# Patient Record
Sex: Male | Born: 1965 | Race: White | Hispanic: No | Marital: Single | Smoking: Never smoker
Health system: Southern US, Community
[De-identification: ages and names within clinical notes are randomized; demographics above are authoritative.]

## PROBLEM LIST (undated history)

## (undated) DIAGNOSIS — I639 Cerebral infarction, unspecified: Secondary | ICD-10-CM

## (undated) DIAGNOSIS — I219 Acute myocardial infarction, unspecified: Secondary | ICD-10-CM

## (undated) DIAGNOSIS — B20 Human immunodeficiency virus [HIV] disease: Secondary | ICD-10-CM

## (undated) HISTORY — PX: BACK SURGERY: SHX140

## (undated) HISTORY — PX: CARDIAC CATHETERIZATION: SHX172

## (undated) HISTORY — PX: OTHER SURGICAL HISTORY: SHX169

---

## 2007-09-27 ENCOUNTER — Ambulatory Visit: Payer: Self-pay | Admitting: Cardiology

## 2007-09-27 ENCOUNTER — Inpatient Hospital Stay (HOSPITAL_COMMUNITY): Admission: EM | Admit: 2007-09-27 | Discharge: 2007-10-03 | Payer: Self-pay | Admitting: Emergency Medicine

## 2009-03-20 ENCOUNTER — Ambulatory Visit (HOSPITAL_COMMUNITY): Admission: RE | Admit: 2009-03-20 | Discharge: 2009-03-20 | Payer: Self-pay | Admitting: Family Medicine

## 2009-09-02 ENCOUNTER — Ambulatory Visit (HOSPITAL_COMMUNITY): Admission: RE | Admit: 2009-09-02 | Discharge: 2009-09-02 | Payer: Self-pay | Admitting: General Surgery

## 2009-09-02 ENCOUNTER — Encounter (INDEPENDENT_AMBULATORY_CARE_PROVIDER_SITE_OTHER): Payer: Self-pay | Admitting: General Surgery

## 2010-11-26 ENCOUNTER — Other Ambulatory Visit: Payer: Self-pay | Admitting: Neurosurgery

## 2010-11-26 DIAGNOSIS — M549 Dorsalgia, unspecified: Secondary | ICD-10-CM

## 2010-11-26 DIAGNOSIS — M5410 Radiculopathy, site unspecified: Secondary | ICD-10-CM

## 2010-11-27 ENCOUNTER — Ambulatory Visit
Admission: RE | Admit: 2010-11-27 | Discharge: 2010-11-27 | Disposition: A | Discharge status: Home / Self Care | Payer: PRIVATE HEALTH INSURANCE | Source: Ambulatory Visit | Attending: Neurosurgery | Admitting: Neurosurgery

## 2010-11-27 DIAGNOSIS — M5410 Radiculopathy, site unspecified: Secondary | ICD-10-CM

## 2010-11-27 DIAGNOSIS — M549 Dorsalgia, unspecified: Secondary | ICD-10-CM

## 2010-12-08 ENCOUNTER — Other Ambulatory Visit: Payer: Self-pay | Admitting: Neurosurgery

## 2010-12-08 DIAGNOSIS — M5410 Radiculopathy, site unspecified: Secondary | ICD-10-CM

## 2010-12-08 DIAGNOSIS — M549 Dorsalgia, unspecified: Secondary | ICD-10-CM

## 2010-12-22 ENCOUNTER — Ambulatory Visit
Admission: RE | Admit: 2010-12-22 | Discharge: 2010-12-22 | Disposition: A | Discharge status: Home / Self Care | Payer: PRIVATE HEALTH INSURANCE | Source: Ambulatory Visit | Attending: Neurosurgery | Admitting: Neurosurgery

## 2010-12-22 ENCOUNTER — Other Ambulatory Visit: Payer: Self-pay | Admitting: Neurosurgery

## 2010-12-22 DIAGNOSIS — M5410 Radiculopathy, site unspecified: Secondary | ICD-10-CM

## 2010-12-22 DIAGNOSIS — M549 Dorsalgia, unspecified: Secondary | ICD-10-CM

## 2011-01-28 LAB — COMPREHENSIVE METABOLIC PANEL
CO2: 29 mEq/L (ref 19–32)
Calcium: 9.1 mg/dL (ref 8.4–10.5)
Creatinine, Ser: 1.05 mg/dL (ref 0.4–1.5)
Glucose, Bld: 88 mg/dL (ref 70–99)
Potassium: 4.1 mEq/L (ref 3.5–5.1)
Total Protein: 7.8 g/dL (ref 6.0–8.3)

## 2011-01-28 LAB — CBC
MCHC: 34.3 g/dL (ref 30.0–36.0)
MCV: 92.7 fL (ref 78.0–100.0)
RDW: 13.8 % (ref 11.5–15.5)
WBC: 6.8 10*3/uL (ref 4.0–10.5)

## 2011-01-28 LAB — DIFFERENTIAL
Basophils Absolute: 0.1 10*3/uL (ref 0.0–0.1)
Basophils Relative: 1 % (ref 0–1)
Eosinophils Absolute: 0.1 10*3/uL (ref 0.0–0.7)
Lymphocytes Relative: 21 % (ref 12–46)
Lymphs Abs: 1.4 10*3/uL (ref 0.7–4.0)
Monocytes Absolute: 0.4 10*3/uL (ref 0.1–1.0)
Monocytes Relative: 6 % (ref 3–12)
Neutro Abs: 4.8 10*3/uL (ref 1.7–7.7)

## 2011-01-29 ENCOUNTER — Other Ambulatory Visit: Payer: Self-pay | Admitting: Neurosurgery

## 2011-01-29 DIAGNOSIS — M549 Dorsalgia, unspecified: Secondary | ICD-10-CM

## 2011-01-29 DIAGNOSIS — M5410 Radiculopathy, site unspecified: Secondary | ICD-10-CM

## 2011-02-03 ENCOUNTER — Ambulatory Visit
Admission: RE | Admit: 2011-02-03 | Discharge: 2011-02-03 | Disposition: A | Discharge status: Home / Self Care | Payer: PRIVATE HEALTH INSURANCE | Source: Ambulatory Visit | Attending: Neurosurgery | Admitting: Neurosurgery

## 2011-02-03 DIAGNOSIS — M549 Dorsalgia, unspecified: Secondary | ICD-10-CM

## 2011-02-03 DIAGNOSIS — M5410 Radiculopathy, site unspecified: Secondary | ICD-10-CM

## 2011-03-10 NOTE — Cardiovascular Report (Signed)
Joseph Walton, Walton               ACCOUNT NO.:  [id number]   MEDICAL RECORD NO.:  [id number]          PATIENT TYPE:  INP   LOCATION:  2807                         FACILITY:  [facility]   PHYSICIAN:  Colleen Can. Deborah Chalk, M.D.DATE OF BIRTH:  1965/12/27   DATE OF PROCEDURE:  09/27/2007  DATE OF DISCHARGE:                            CARDIAC CATHETERIZATION   PROCEDURE:  Left heart catheterization with selective coronary  angiography, left ventricular angiography, with percutaneous coronary  intervention of the right coronary artery, all in the setting of acute  inferior myocardial infarction.   TYPE AND SITE OF ENTRY:  Percutaneous, right femoral artery.   CATHETERS:  T 6-French 4-curved Judkins right and left coronary  catheters, a 6-French pigtail ventriculographic catheter, a 6-French JR-  4 guide with side holes, a combination of Prowater guidewire and an  Asahi soft guidewire, 2.5 x 15-mm Maverick balloon, a Fetch extraction  catheter, subsequent stents with a 3.5 x 32-mm Liberte distally and a  3.5 x a 15-mm Liberte proximally.   MEDICATIONS GIVEN DURING PROCEDURE:  Zofran 8 mg IV, verapamil 200 mcg  intracoronary, Pepcid, Plavix, heparin and Integrilin.   COMMENT:  It was difficult crossing the stenosis initially.  Brett Canales had a  good bit of nausea and hypotension, but resolved with fluid as well as  dopamine infusion.   PRESSURES:  Aortic pressure was 103/74.  LV was 99/16-24.  There was no  aortic valve gradient noted on pullback.   ANGIOGRAPHIC DATA:  1. The left main coronary artery had 40% to 50% distal stenosis.  It      was a relatively long vessel.  There were the left anterior      descending, intermediate and left circumflex that arose in somewhat      of trifurcation pattern.  2. Left circumflex:  The left circumflex had a large obtuse marginal      with a somewhat tubular 60% narrowing.  3. Left anterior descending had diffuse irregularities and a 40% to      50%  proximal narrowing.  4. The intermediate coronary was of moderate size with 40% to 50%      narrowing.  5. The right coronary artery was totally occluded proximally.  There      was a conus and RV branch with severe stenosis at the origin of      these vessels.   LEFT VENTRICULAR ANGIOGRAM:  The left ventricular angiogram was  performed after the PCI.  This showed inferior wall hypokinesis with a  global ejection fraction estimated to be 40% to 45%.   PERCUTANEOUS CORONARY INTERVENTION:  The JR-4 guide with side holes  provided adequate backup.  We had difficulty in getting the guidewire to  cross the occluded portion of the vessel.  It would appear that there  was a flap from a dissected plaque.  We were able to pass under the  medial side without having subintimal dissection plane.  We initially  passed the wire distally and were able to predilated with a 2.5 x 15-mm  Maverick balloon up to 18-20 atmospheres.  At that point  in time, we had  a considerable intraluminal filling defect consistent with clot.  The  Fetch catheter was used with 3 separate runs; this resulted in removal  of obvious thrombus and improvement of the vessel angiographically.  We  then returned with initially a 3.5 x 32-mm Liberte through the distal  stenosis and then overlap that stent proximally with a 3.5 x 15-mm  Liberte proximally.  We were all within approximately a centimeter of  the ostium and there was some residual narrowing at the ostium, but it  was not felt to be flow-limiting.  After the stents were placed, there  was a posterior lateral branch which had abrupt cutoff.  We passed the  guidewire distal in that to try to mechanically disrupt what was  presumed to be a distal clot, but really did not result in intermediate  reperfusion.  Overall, we had excellent flow into 2 different branches  on the inferior wall which included the large posterior descending  vessel and was felt to be a successful  angioplasty.   OVERALL IMPRESSION:  1. Acute inferior myocardial infarction with successful angioplasty      and stent placement in the right coronary artery with non-drug-      eluting stents.  2. 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   DISCUSSION:  The patient will be managed medically initially.  It is  felt that he should be able to have a satisfactory short-term benefit  with the angioplasty procedure, but his long-term results will hinge  more on modification of cardiovascular risk factors.      Colleen Can. Deborah Chalk, M.D.  Electronically Signed     SNT/MEDQ  D:  09/27/2007  T:  09/28/2007  Job:  [id number]

## 2011-03-10 NOTE — H&P (Signed)
[name], Joseph Walton               ACCOUNT NO.:  [id number]   MEDICAL RECORD NO.:  [id number]          PATIENT TYPE:  INP   LOCATION:  2807                         FACILITY:  [facility]   PHYSICIAN:  Colleen Can. Deborah Chalk, M.D.DATE OF BIRTH:  1966/09/11   DATE OF ADMISSION:  09/27/2007  DATE OF DISCHARGE:                              HISTORY & PHYSICAL   PRIMARY CARDIOLOGIST:  The patient has no cardiologist.   TOTAL VISIT TIME:  Approximately 30 minutes.   CHIEF COMPLAINT:  He is being transferred for chest pain and abnormal  EKG from [facility].  The patient is full code.  The patient  was the historian, he was a good historian.   HISTORY OF PRESENT ILLNESS:  Mr. Joseph Walton is a 45 year old male with no  history of coronary artery disease, history of hypertension and history  of gastroesophageal reflux disease who notes a sudden onset of chest  pain, less precordial, described as a gaseous feeling anteriorly around  1:30 p.m. on the day of admission.  He notes it was 7/10 and he took a  full dose aspirin and had intermittent episodes of discomfort until it  became severe again around 3:30 p.m. and took another aspirin.  Since  then, he has had multiple episodes of nausea and vomiting.  The patient  then took a Nexium at 4:30 p.m. and at 6:00 p.m. and then drove himself  to [facility] where an EKG was performed showing a possible  inferior, posterior infarct.  The patient was given aspirin 324 mg,  which were 4 baby aspirin and 1 inch of NitroPaste.  His blood pressure  is 166/102, but his heart rate was in the low 100s at the time and was  transferred emergently to [facility] Emergency Room.  In the emergency  room, his vital signs were similar.  He was given a heparin bolus 5000  then a drip.  He was also given a nitroglycerin drip and Lopressor 5 mg  IV x2 and he notes his chest pain went from a 5/10 to a 0/10 after the  treatment.  He was emergently taken to the  catheterization laboratory.   PAST MEDICAL HISTORY:  As listed above.  He is also status post  tonsillectomy.   ALLERGIES:  HE IS ALLERGIC TO SULFA, WHICH CAUSES NAUSEA.   MEDICATIONS:  He takes hydrochlorothiazide as needed for elevated blood  pressure and Nexium as needed.   The patient has never had a heart catheterization and never had an  echocardiogram.   SOCIAL HISTORY:  He lives alone.  He drinks alcohol occasionally.  He  has been smoking on and off since the age of 3.   FAMILY HISTORY:  He notes, his maternal mother, who he is not in contact  with, had a heart attack in her 84s.   REVIEW OF SYSTEMS:  A 14-point review of systems negative as stated  above.   PHYSICAL EXAMINATION:  VITAL SIGNS:  He is afebrile with a pulse of 102,  respiratory rate was 22, blood pressure 160/100 with saturations at 98%  on 2  liters.  IN GENERAL:  He is a male of his stated age in mild distress.  HEENT:  Normocephalic, atraumatic.  His pupils are equal, round and  reactive to light.  Extraocular movements being intact.  Sclerae are  clear.  Oropharynx shows no posterior pharyngeal lesions.  NECK:  Supple with no lymphadenopathy or thyromegaly.  No jugular venous  distention was noted.  No carotid bruits.  CARDIOVASCULAR EXAM:  Is a regular rhythm with a fast rate with no  murmurs, rubs or gallops.  Normal S1, S2.  No S3 or S4 noted.  VASCULAR EXAM:  Includes now femoral, which is 2+ bilaterally and  symmetric and dorsalis pedis, which are also 2+ bilaterally symmetric.  ABDOMEN:  Mildly obese, soft, nontender, nondistended.  No  hepatosplenomegaly noted.  EXTREMITIES:  Showed no clubbing, cyanosis or edema.  LUNGS:  Clear to auscultation bilaterally.  MUSCULOSKELETAL:  He had no signs of joint deformities or effusions.  SKIN:  Shows no rashes or lesions.  NEUROLOGIC EXAM:  He is alert and oriented x4.  Cranial nerves II-XII  grossly intact.  Strength and sensation grossly intact.    LABORATORY DATA:  Chest x-ray was pending.  EKG, performed in the  emergency room here at [facility], which showed sinus tachycardia, event  of 122 with normal axis.  He had no signs of LVH.  He had 2 mm ST  elevations in 2 and AVL and ST depressions in V1 to V3 with the most  prominent being in V2, which was 3 mm and he had prominent R wave  consistent with a possible inferior/posterior MI.  The patient's PR  interval is 146, QRS 108, QT corrected at 445.  Labs are pending.   ASSESSMENT/PLAN:  This is a patient with no history of heart disease who  presented with an acute myocardial infarction, still having chest pain.  He was emergently taken to the catheterization laboratory with Dr.  Deborah Chalk being the interventional cardiologist.  He will likely be  retained on beta-blockade and started on an ACE inhibitor with an  assessment of the ejection fraction after revascularization.  Yet, he  will also likely be on statin therapy and we will check his lipids.  He  will also be on anticoagulants and antiplatelet therapy.  He notes he  will stop smoking and he was advised of this in the emergency room.  His  blood pressure will be controlled with a beta-blockade and possibly an  ACE inhibitor.      Darryl D. Prime, MD   Electronically Signed     ______________________________  Colleen Can. Deborah Chalk, M.D.    DDP/MEDQ  D:  09/27/2007  T:  09/28/2007  Job:  [id number]

## 2011-03-10 NOTE — Discharge Summary (Signed)
NAMEAYVIN, LIPINSKI               ACCOUNT NO.:  [id number]   MEDICAL RECORD NO.:  [id number]          PATIENT TYPE:  INP   LOCATION:  2039                         FACILITY:  [facility]   PHYSICIAN:  Colleen Can. Deborah Chalk, M.D.DATE OF BIRTH:  December 11, 1965   DATE OF ADMISSION:  09/27/2007  DATE OF DISCHARGE:  10/03/2007                               DISCHARGE SUMMARY   DISCHARGE DIAGNOSES:  1. Acute ST elevation inferior myocardial infarction, with subsequent      percutaneous coronary intervention to the right coronary artery      with two Liberte stents (3.5 x 32 distally, 3.5 x 15 mm      proximally).  2. Hypertension.  3. Dyslipidemia.  4. Gastroesophageal reflux disease.   HISTORY OF PRESENT ILLNESS:  The patient is a 45 year old white male who  presented initially to Urgent Care with chest pain.  He thought that he  had indigestion.  He had taking aspirin as well as multiple Nexium.  His  EKG showed acute changes.  He was subsequent brought to the [facility]  emergency room with code STEMI called.  His onset of chest pain was  approximately 1:30 p.m. on the day of admission, and he was transferred  at approximately 6 p.m.   Please see the history and physical for the patient presentation and  profile.   LABORATORY DATA ON ADMISSION:  CK total was 4719, with an MB fraction of  423.  Troponins initially were undeterminable, the third one was 99.  His chemistry showed a BUN of 8, creatinine 0.8, potassium was 3.8,  glucose was 116.  CBC showed a white count of 11,000, hemoglobin 13,  hematocrit 37.  His total cholesterol was 158, LDL 108, HDL is 18,  triglycerides are 159.   Chest x-ray showed right upper lobe scarring versus subsegmental  atelectasis.   HOSPITAL COURSE:  The patient was admitted with code STEMI called.  He  was taken emergently to the cardiac catheterization lab per Dr. Roger Shelter.  That procedure was tolerated well, without any known  complications.  The  proximal right coronary was 110 occluded.  The conus  RV branch had severe stenosis.  The left main has a 40%-50% distal  narrowing.  The left circumflex has a 60% narrowing in obtuse marginal  branch.  The LAD has diffuse irregularities of 40%-50% proximally.  The  intermediate has 40-50% narrowings as well.  Subsequent PCI was  performed of the right coronary.  Subsequent stents times two were  placed.  There was a good result, with partial residual occlusion of a  distal posterolateral branch.  His ejection fraction was 40%-45%.  Postprocedure, he was transferred to the coronary care unit.  He has  basically done well throughout the remainder of his hospitalization.  He  has had some intermittent fever, with negative CBC and negative  urinalysis.  This is felt to be secondary to the myocardial injury.  He  was subsequently transferred down to 2000.  Cardiac rehab has been  initiated.  He has had no further complaints of chest pain, and today on  October 03, 2007, he is doing well.  Physical exam is unremarkable.  His  maximum temperature has been 99.1, and he is felt to be a satisfactory  candidate for discharge today.   DISCHARGE CONDITION:  Stable.   DISCHARGE DIET:  Low salt, heart healthy.   ACTIVITY:  No lifting, no driving, no sexual activity.  He may walk 5-10  minutes 2 times a day.   He is to avoid any types of straining or activities and would cause  chest pain, shortness of breath, dizziness. etc.  He is strongly  encouraged to refrain from smoking.   DISCHARGE MEDICATIONS:  1. Pravachol 40 mg a day.  2. Pepcid 20 mg 2 times a day.  3. Lopressor 50 b.i.d.  4. Aspirin 325 mg a day.  5. Lisinopril 5 mg a day.  6. Plavix 75 mg a day.  7. Nitroglycerin p.r.n..   We will plan on seeing him back in the office in approximately 10 days.  He is asked to call to schedule that appointment, certainly sooner if  any problems arise in the interim.      Sharlee Blew,  N.P.      Colleen Can. Deborah Chalk, M.D.  Electronically Signed    LC/MEDQ  D:  10/03/2007  T:  10/03/2007  Job:  [id number]

## 2011-07-28 ENCOUNTER — Other Ambulatory Visit: Payer: Self-pay | Admitting: Neurosurgery

## 2011-07-28 DIAGNOSIS — M549 Dorsalgia, unspecified: Secondary | ICD-10-CM

## 2011-08-03 LAB — CBC
HCT: 36.6 — ABNORMAL LOW
Hemoglobin: 12.5 — ABNORMAL LOW
MCHC: 35
MCV: 89.7
MCV: 92.1
Platelets: 200
WBC: 11.1 — ABNORMAL HIGH
WBC: 6

## 2011-08-03 LAB — I-STAT EC8
Acid-base deficit: 2
HCT: 44
Hemoglobin: 15
Operator id: 211741
TCO2: 24
pH, Arterial: 7.384

## 2011-08-03 LAB — DIFFERENTIAL
Basophils Absolute: 0
Basophils Relative: 0
Eosinophils Absolute: 0 — ABNORMAL LOW
Eosinophils Relative: 0
Lymphs Abs: 1.6
Monocytes Absolute: 0.5
Monocytes Relative: 4
Neutrophils Relative %: 81 — ABNORMAL HIGH

## 2011-08-03 LAB — BASIC METABOLIC PANEL
Calcium: 8.2 — ABNORMAL LOW
Chloride: 105
GFR calc Af Amer: >60
GFR calc non Af Amer: >60
Glucose, Bld: 103 — ABNORMAL HIGH
Potassium: 3.8
Sodium: 137
Sodium: 139

## 2011-08-03 LAB — URINE CULTURE
Colony Count: NO GROWTH
Culture: NO GROWTH
Special Requests: NEGATIVE

## 2011-08-03 LAB — URINALYSIS, ROUTINE W REFLEX MICROSCOPIC
Bilirubin Urine: NEGATIVE
Hgb urine dipstick: NEGATIVE
Specific Gravity, Urine: 1.035 — ABNORMAL HIGH
pH: 6.5

## 2011-08-03 LAB — CARDIAC PANEL(CRET KIN+CKTOT+MB+TROPI)
CK, MB: 212.7 — ABNORMAL HIGH
Relative Index: 9 — ABNORMAL HIGH
Total CK: 4719 — ABNORMAL HIGH
Troponin I: >100

## 2011-08-03 LAB — LIPID PANEL
HDL: 18 — ABNORMAL LOW
LDL Cholesterol: 108 — ABNORMAL HIGH
Triglycerides: 159 — ABNORMAL HIGH

## 2011-08-03 LAB — URINE MICROSCOPIC-ADD ON

## 2011-08-03 LAB — POCT I-STAT CREATININE: Operator id: 211741

## 2011-08-04 ENCOUNTER — Ambulatory Visit
Admission: RE | Admit: 2011-08-04 | Discharge: 2011-08-04 | Disposition: A | Discharge status: Home / Self Care | Payer: PRIVATE HEALTH INSURANCE | Source: Ambulatory Visit | Attending: Neurosurgery | Admitting: Neurosurgery

## 2011-08-04 ENCOUNTER — Other Ambulatory Visit: Payer: Self-pay | Admitting: Neurosurgery

## 2011-08-04 DIAGNOSIS — M549 Dorsalgia, unspecified: Secondary | ICD-10-CM

## 2011-08-04 MED ORDER — METHYLPREDNISOLONE ACETATE 40 MG/ML INJ SUSP (RADIOLOG
120.0000 mg | Freq: Once | INTRAMUSCULAR | Status: AC
  Administered 2011-08-04: 120 mg via EPIDURAL

## 2011-08-04 MED ORDER — IOHEXOL 180 MG/ML  SOLN
1.0000 mL | Freq: Once | INTRAMUSCULAR | Status: AC | PRN
  Administered 2011-08-04: 1 mL via EPIDURAL

## 2012-12-28 ENCOUNTER — Other Ambulatory Visit: Payer: Self-pay | Admitting: Neurosurgery

## 2012-12-28 DIAGNOSIS — M549 Dorsalgia, unspecified: Secondary | ICD-10-CM

## 2013-01-02 ENCOUNTER — Ambulatory Visit
Admission: RE | Admit: 2013-01-02 | Discharge: 2013-01-02 | Disposition: A | Discharge status: Home / Self Care | Payer: PRIVATE HEALTH INSURANCE | Source: Ambulatory Visit | Attending: Neurosurgery | Admitting: Neurosurgery

## 2013-01-02 DIAGNOSIS — M549 Dorsalgia, unspecified: Secondary | ICD-10-CM

## 2013-01-02 MED ORDER — IOHEXOL 180 MG/ML  SOLN
1.0000 mL | Freq: Once | INTRAMUSCULAR | Status: AC | PRN
  Administered 2013-01-02: 1 mL via EPIDURAL

## 2013-01-02 MED ORDER — METHYLPREDNISOLONE ACETATE 40 MG/ML INJ SUSP (RADIOLOG
120.0000 mg | Freq: Once | INTRAMUSCULAR | Status: AC
  Administered 2013-01-02: 120 mg via EPIDURAL

## 2013-01-09 ENCOUNTER — Other Ambulatory Visit: Payer: Self-pay | Admitting: Neurosurgery

## 2013-01-16 ENCOUNTER — Ambulatory Visit
Admission: RE | Admit: 2013-01-16 | Discharge: 2013-01-16 | Disposition: A | Discharge status: Home / Self Care | Payer: PRIVATE HEALTH INSURANCE | Source: Ambulatory Visit | Attending: Neurosurgery | Admitting: Neurosurgery

## 2013-01-16 ENCOUNTER — Other Ambulatory Visit: Payer: Self-pay | Admitting: Neurosurgery

## 2013-01-16 VITALS — BP 141/82 | HR 91

## 2013-01-16 DIAGNOSIS — M549 Dorsalgia, unspecified: Secondary | ICD-10-CM

## 2013-01-16 MED ORDER — IOHEXOL 180 MG/ML  SOLN
1.0000 mL | Freq: Once | INTRAMUSCULAR | Status: AC | PRN
  Administered 2013-01-16: 1 mL via EPIDURAL

## 2013-01-16 MED ORDER — METHYLPREDNISOLONE ACETATE 40 MG/ML INJ SUSP (RADIOLOG
120.0000 mg | Freq: Once | INTRAMUSCULAR | Status: AC
  Administered 2013-01-16: 120 mg via EPIDURAL

## 2013-06-16 ENCOUNTER — Other Ambulatory Visit: Payer: Self-pay | Admitting: Neurosurgery

## 2013-06-16 DIAGNOSIS — M549 Dorsalgia, unspecified: Secondary | ICD-10-CM

## 2013-06-16 DIAGNOSIS — M5410 Radiculopathy, site unspecified: Secondary | ICD-10-CM

## 2013-06-27 ENCOUNTER — Ambulatory Visit
Admission: RE | Admit: 2013-06-27 | Discharge: 2013-06-27 | Disposition: A | Discharge status: Home / Self Care | Payer: PRIVATE HEALTH INSURANCE | Source: Ambulatory Visit | Attending: Neurosurgery | Admitting: Neurosurgery

## 2013-06-27 VITALS — BP 143/100 | HR 68

## 2013-06-27 DIAGNOSIS — M5410 Radiculopathy, site unspecified: Secondary | ICD-10-CM

## 2013-06-27 DIAGNOSIS — M549 Dorsalgia, unspecified: Secondary | ICD-10-CM

## 2013-06-27 MED ORDER — IOHEXOL 180 MG/ML  SOLN
1.0000 mL | Freq: Once | INTRAMUSCULAR | Status: AC | PRN
  Administered 2013-06-27: 1 mL via EPIDURAL

## 2013-06-27 MED ORDER — METHYLPREDNISOLONE ACETATE 40 MG/ML INJ SUSP (RADIOLOG
120.0000 mg | Freq: Once | INTRAMUSCULAR | Status: AC
  Administered 2013-06-27: 120 mg via EPIDURAL

## 2014-01-24 ENCOUNTER — Other Ambulatory Visit: Payer: Self-pay | Admitting: Pulmonary Disease

## 2014-01-24 DIAGNOSIS — M545 Low back pain, unspecified: Secondary | ICD-10-CM

## 2014-01-29 ENCOUNTER — Other Ambulatory Visit: Payer: Self-pay | Admitting: Pulmonary Disease

## 2014-01-29 DIAGNOSIS — M545 Low back pain, unspecified: Secondary | ICD-10-CM

## 2014-01-31 ENCOUNTER — Other Ambulatory Visit: Payer: PRIVATE HEALTH INSURANCE

## 2014-01-31 ENCOUNTER — Other Ambulatory Visit: Payer: Self-pay

## 2016-05-01 ENCOUNTER — Other Ambulatory Visit (HOSPITAL_COMMUNITY): Payer: Self-pay | Admitting: Pulmonary Disease

## 2016-05-01 DIAGNOSIS — I6389 Other cerebral infarction: Secondary | ICD-10-CM

## 2016-05-08 ENCOUNTER — Other Ambulatory Visit (HOSPITAL_COMMUNITY): Payer: Self-pay | Admitting: Pulmonary Disease

## 2016-05-08 DIAGNOSIS — I6320 Cerebral infarction due to unspecified occlusion or stenosis of unspecified precerebral arteries: Secondary | ICD-10-CM

## 2016-05-11 ENCOUNTER — Ambulatory Visit (HOSPITAL_COMMUNITY): Payer: Self-pay

## 2016-05-11 ENCOUNTER — Ambulatory Visit (HOSPITAL_COMMUNITY)
Admission: RE | Admit: 2016-05-11 | Discharge: 2016-05-11 | Disposition: A | Discharge status: Home / Self Care | Payer: PRIVATE HEALTH INSURANCE | Source: Ambulatory Visit | Attending: Pulmonary Disease | Admitting: Pulmonary Disease

## 2016-05-11 DIAGNOSIS — I6320 Cerebral infarction due to unspecified occlusion or stenosis of unspecified precerebral arteries: Secondary | ICD-10-CM | POA: Insufficient documentation

## 2016-05-13 ENCOUNTER — Other Ambulatory Visit (HOSPITAL_COMMUNITY): Payer: Self-pay | Admitting: Pulmonary Disease

## 2016-05-13 DIAGNOSIS — I6310 Cerebral infarction due to embolism of unspecified precerebral artery: Secondary | ICD-10-CM

## 2016-05-15 ENCOUNTER — Ambulatory Visit (HOSPITAL_COMMUNITY)
Admission: RE | Admit: 2016-05-15 | Discharge: 2016-05-15 | Disposition: A | Discharge status: Home / Self Care | Payer: PRIVATE HEALTH INSURANCE | Source: Ambulatory Visit | Attending: Pulmonary Disease | Admitting: Pulmonary Disease

## 2016-05-15 DIAGNOSIS — I071 Rheumatic tricuspid insufficiency: Secondary | ICD-10-CM | POA: Insufficient documentation

## 2016-05-15 DIAGNOSIS — I350 Nonrheumatic aortic (valve) stenosis: Secondary | ICD-10-CM | POA: Diagnosis not present

## 2016-05-15 DIAGNOSIS — K219 Gastro-esophageal reflux disease without esophagitis: Secondary | ICD-10-CM | POA: Insufficient documentation

## 2016-05-15 DIAGNOSIS — I255 Ischemic cardiomyopathy: Secondary | ICD-10-CM | POA: Diagnosis not present

## 2016-05-15 DIAGNOSIS — I6789 Other cerebrovascular disease: Secondary | ICD-10-CM

## 2016-05-15 DIAGNOSIS — Z8673 Personal history of transient ischemic attack (TIA), and cerebral infarction without residual deficits: Secondary | ICD-10-CM | POA: Diagnosis present

## 2016-05-15 DIAGNOSIS — I10 Essential (primary) hypertension: Secondary | ICD-10-CM | POA: Diagnosis not present

## 2016-05-15 DIAGNOSIS — E785 Hyperlipidemia, unspecified: Secondary | ICD-10-CM | POA: Diagnosis not present

## 2016-05-15 DIAGNOSIS — I340 Nonrheumatic mitral (valve) insufficiency: Secondary | ICD-10-CM | POA: Insufficient documentation

## 2016-05-15 NOTE — Progress Notes (Signed)
*  PRELIMINARY RESULTS* Echocardiogram 2D Echocardiogram has been performed.  Stacey DrainWhite, Afsana Liera J 05/15/2016, 11:14 AM

## 2016-05-17 ENCOUNTER — Telehealth: Payer: Self-pay | Admitting: Infectious Disease

## 2016-05-17 ENCOUNTER — Emergency Department (HOSPITAL_COMMUNITY)
Admission: EM | Admit: 2016-05-17 | Discharge: 2016-05-17 | Disposition: A | Discharge status: Home / Self Care | Payer: PRIVATE HEALTH INSURANCE | Attending: Emergency Medicine | Admitting: Emergency Medicine

## 2016-05-17 ENCOUNTER — Encounter (HOSPITAL_COMMUNITY): Payer: Self-pay | Admitting: Emergency Medicine

## 2016-05-17 ENCOUNTER — Emergency Department (HOSPITAL_COMMUNITY): Payer: PRIVATE HEALTH INSURANCE

## 2016-05-17 DIAGNOSIS — Z8673 Personal history of transient ischemic attack (TIA), and cerebral infarction without residual deficits: Secondary | ICD-10-CM | POA: Diagnosis not present

## 2016-05-17 DIAGNOSIS — Z7982 Long term (current) use of aspirin: Secondary | ICD-10-CM | POA: Diagnosis not present

## 2016-05-17 DIAGNOSIS — Z21 Asymptomatic human immunodeficiency virus [HIV] infection status: Secondary | ICD-10-CM | POA: Diagnosis not present

## 2016-05-17 DIAGNOSIS — I252 Old myocardial infarction: Secondary | ICD-10-CM | POA: Insufficient documentation

## 2016-05-17 DIAGNOSIS — B20 Human immunodeficiency virus [HIV] disease: Secondary | ICD-10-CM

## 2016-05-17 DIAGNOSIS — B3781 Candidal esophagitis: Secondary | ICD-10-CM

## 2016-05-17 DIAGNOSIS — R066 Hiccough: Secondary | ICD-10-CM | POA: Diagnosis present

## 2016-05-17 DIAGNOSIS — Z79899 Other long term (current) drug therapy: Secondary | ICD-10-CM | POA: Diagnosis not present

## 2016-05-17 HISTORY — DX: Cerebral infarction, unspecified: I63.9

## 2016-05-17 HISTORY — DX: Acute myocardial infarction, unspecified: I21.9

## 2016-05-17 LAB — I-STAT TROPONIN, ED: TROPONIN I, POC: 0 ng/mL (ref 0.00–0.08)

## 2016-05-17 LAB — COMPREHENSIVE METABOLIC PANEL
ALK PHOS: 76 U/L (ref 38–126)
ALT: 48 U/L (ref 17–63)
AST: 50 U/L — ABNORMAL HIGH (ref 15–41)
Albumin: 3.6 g/dL (ref 3.5–5.0)
Anion gap: 5 (ref 5–15)
BILIRUBIN TOTAL: 0.6 mg/dL (ref 0.3–1.2)
BUN: 8 mg/dL (ref 6–20)
CHLORIDE: 110 mmol/L (ref 101–111)
CO2: 26 mmol/L (ref 22–32)
CREATININE: 0.83 mg/dL (ref 0.61–1.24)
Calcium: 8.9 mg/dL (ref 8.9–10.3)
GFR calc Af Amer: >60 mL/min (ref >60)
Glucose, Bld: 83 mg/dL (ref 65–99)
POTASSIUM: 3.6 mmol/L (ref 3.5–5.1)
Sodium: 141 mmol/L (ref 135–145)
Total Protein: 8.3 g/dL — ABNORMAL HIGH (ref 6.5–8.1)

## 2016-05-17 LAB — CBC
HEMATOCRIT: 36.5 % — AB (ref 39.0–52.0)
HEMOGLOBIN: 12.2 g/dL — AB (ref 13.0–17.0)
MCH: 30.9 pg (ref 26.0–34.0)
MCHC: 33.4 g/dL (ref 30.0–36.0)
MCV: 92.4 fL (ref 78.0–100.0)
Platelets: 97 10*3/uL — ABNORMAL LOW (ref 150–400)
RBC: 3.95 MIL/uL — ABNORMAL LOW (ref 4.22–5.81)
RDW: 14 % (ref 11.5–15.5)
WBC: 2.9 10*3/uL — ABNORMAL LOW (ref 4.0–10.5)

## 2016-05-17 LAB — RAPID HIV SCREEN (HIV 1/2 AB+AG)
HIV 1/2 ANTIBODIES: REACTIVE — AB
HIV-1 P24 Antigen - HIV24: REACTIVE — AB

## 2016-05-17 MED ORDER — CHLORPROMAZINE HCL 10 MG PO TABS: 10.0000 mg | ORAL_TABLET | Freq: Three times a day (TID) | ORAL | 0 refills | Status: DC | PRN

## 2016-05-17 MED ORDER — FLUCONAZOLE 100 MG PO TABS
200.0000 mg | ORAL_TABLET | Freq: Once | ORAL | Status: AC
  Administered 2016-05-17: 200 mg via ORAL
  Filled 2016-05-17: qty 2

## 2016-05-17 MED ORDER — CHLORPROMAZINE HCL 50 MG/2ML IJ SOLN
INTRAMUSCULAR | Status: AC
  Filled 2016-05-17: qty 2

## 2016-05-17 MED ORDER — SODIUM CHLORIDE 0.9 % IV SOLN
25.0000 mg | Freq: Once | INTRAVENOUS | Status: AC
  Administered 2016-05-17: 25 mg via INTRAVENOUS
  Filled 2016-05-17: qty 1

## 2016-05-17 MED ORDER — FLUCONAZOLE 200 MG PO TABS: 200.0000 mg | ORAL_TABLET | Freq: Two times a day (BID) | ORAL | 0 refills | Status: DC

## 2016-05-17 NOTE — Discharge Instructions (Signed)
Call the infectious disease clinic for follow up as soon as possible.

## 2016-05-17 NOTE — ED Notes (Signed)
Hiccups subsided.

## 2016-05-17 NOTE — ED Triage Notes (Addendum)
Patient c/o persistent hiccups for more than 12 hours. Denies any other symptoms. Per family patient fell this morning, denies hitting head or LOC. Patient denies any pain. Per family patients' hiccup are relieved by certain positions. Patient admitted 2 weeks ago for stroke.

## 2016-05-17 NOTE — ED Notes (Signed)
Patient with no complaints at this time. Respirations even and unlabored. Skin warm/dry. Discharge instructions reviewed with patient at this time. Patient given opportunity to voice concerns/ask questions. IV removed per policy and band-aid applied to site. Patient discharged at this time and left Emergency Department with steady gait.  

## 2016-05-17 NOTE — Telephone Encounter (Signed)
Patient's rapid HIV test + in ED. He had thrush c/w with advanced HIV. He needs to be brought in for visit, intake, labs and started on meds. Family not aware of his diagnosis.  Tammy, I am ccing you on this.This can go to DIS officer but based on his presentation sound like he needs to come into care very soon. He was made aware of test in ED being + (rapid)  (I have occ  been checking in parallel with on call ID MD to see when we might have an acute HIV case)

## 2016-05-17 NOTE — ED Provider Notes (Signed)
[facility]-EMERGENCY DEPT Provider Note   CSN: [id number] Arrival date & time: 05/17/16  1338  First Provider Contact:  None       History   Chief Complaint Chief Complaint  Patient presents with  . Hiccups    HPI Joseph Walton is a 50 y.o. male.  HPI  Joseph Walton is a 50 year old man with a history of MI and stroke who comes in today with complaints of hiccups. His family reports he has had a couple for the past 3-4 hours. He has had no similar symptoms in the past. He has had a stroke in the past several months and is being worked up for that at [facility] Walton office. Family states that he has been living elsewhere. They noticed that he had slurred speech and went down to where he lived and got him about a month ago. They noted that he was having a staggering gait. Review of CT obtained on July 17 shows a subacute or old cortical and subcortical infarction of the right posterior frontal lobe probable similar age infarction of the right lateral thalamus. He denies any history of esophageal problems or HIV. He denies any pain or new symptoms from his stroke.   Past Medical History:  Diagnosis Date  . MI (myocardial infarction) (HCC)   . Stroke [facility])     There are no active problems to display for this patient.   Past Surgical History:  Procedure Laterality Date  . BRAIN SURGERY    . CARDIAC CATHETERIZATION    . cardiac stents         Home Medications    Prior to Admission medications   Medication Sig Start Date End Date Taking? Authorizing Provider  aspirin 81 MG tablet Take 81 mg by mouth every morning.   Yes Historical Provider, MD  atorvastatin (LIPITOR) 80 MG tablet Take 40 mg by mouth every morning. 05/12/16  Yes Historical Provider, MD  clopidogrel (PLAVIX) 75 MG tablet Take 75 mg by mouth every morning. 05/12/16  Yes Historical Provider, MD  diphenhydramine-acetaminophen (TYLENOL PM) 25-500 MG TABS tablet Take 2 tablets by mouth at bedtime as needed (for sleep).    Yes Historical Provider, MD  Potassium Chloride ER 20 MEQ TBCR Take 20 mEq by mouth every morning. 05/14/16  Yes Historical Provider, MD    Family History No family history on file.  Social History Social History  Substance Use Topics  . Smoking status: Never Smoker  . Smokeless tobacco: Never Used  . Alcohol use No     Allergies   Review of patient's allergies indicates no known allergies.   Review of Systems Review of Systems  All other systems reviewed and are negative.    Physical Exam Updated Vital Signs BP 113/87 (BP Location: Left Arm)   Pulse 82   Temp 98.2 F (36.8 C)   Resp 17   Ht  (1.778 m)   Wt 73 kg   SpO2 100%   BMI 23.10 kg/m   Physical Exam  Constitutional: He is oriented to person, place, and time. He appears well-developed and well-nourished. No distress.  HENT:  Head: Normocephalic and atraumatic.  Right Ear: External ear normal.  Left Ear: External ear normal.  Some whitish ring on tongue and in oropharynx  Eyes: EOM are normal. Pupils are equal, round, and reactive to light.  Neck: Normal range of motion. Neck supple.  Cardiovascular: Normal rate, regular rhythm, normal heart sounds and intact distal pulses.   Pulmonary/Chest: Effort  normal and breath sounds normal.  Abdominal: Soft. Bowel sounds are normal.  Musculoskeletal: Normal range of motion.  Neurological: He is alert and oriented to person, place, and time. A cranial nerve deficit is present. He exhibits abnormal muscle tone.  Skin: Skin is warm. Capillary refill takes less than 2 seconds.  Psychiatric: He has a normal mood and affect.  Nursing note and vitals reviewed.    ED Treatments / Results  Labs (all labs ordered are listed, but only abnormal results are displayed) Labs Reviewed  CBC - Abnormal; Notable for the following:       Result Value   WBC 2.9 (*)    RBC 3.95 (*)    Hemoglobin 12.2 (*)    HCT 36.5 (*)    Platelets 97 (*)    All other components  within normal limits  COMPREHENSIVE METABOLIC PANEL  RAPID HIV SCREEN (HIV 1/2 AB+AG)  I-STAT TROPOININ, ED    EKG  EKG Interpretation  Date/Time:  Sunday May 17 2016 16:35:24 EDT Ventricular Rate:  82 PR Interval:    QRS Duration: 103 QT Interval:  407 QTC Calculation: 476 R Axis:   38 Text Interpretation:  Normal sinus rhythm Inferior Q waves noted Confirmed by Joseph Coburn MD, Joseph Walton ([id number]) on 05/17/2016 5:25:05 PM       Radiology No results found.  Procedures Procedures (including critical care time)  Medications Ordered in ED Medications  chlorproMAZINE (THORAZINE) 25 mg in sodium chloride 0.9 % 25 mL IVPB (25 mg Intravenous Given 05/17/16 1651)     Initial Impression / Assessment and Plan / ED Course  I have reviewed the triage vital signs and the nursing notes.  Pertinent labs & imaging results that were available during my care of the patient were reviewed by me and considered in my medical decision making (see chart for details).  Clinical Course  50 year old male with recent stroke with right-sided residual deficits presents today with hiccups and exam concerning for esophageal candidiasis. Patient had reactive HIV test done here in emergency department. He denies any previous positive HIV test. He does tell me that he has sex with other men. However, his family is unaware that he is gay and he does not wish them to know about his HIV status. I have had a long discussion with him about his need for close follow-up. He understands that this will be on his discharge summary. Plan treat esophageal candidiasis refer for HIV treatment. 1 hiccups plan treatment with Thorazine likely secondary to candidal esophagitis 2 candida esophagitis plan treatment with fluconazole 3 hiv referred to id clinic  Final Clinical Impressions(s) / ED Diagnoses   Final diagnoses:  Candida esophagitis (HCC)  Hiccups  HIV infection (HCC)    New Prescriptions New Prescriptions   No  medications on file     Joseph Grizzle, MD 05/17/16 1836

## 2016-05-18 ENCOUNTER — Ambulatory Visit (HOSPITAL_COMMUNITY)
Admission: RE | Admit: 2016-05-18 | Payer: PRIVATE HEALTH INSURANCE | Source: Ambulatory Visit | Admitting: Pulmonary Disease

## 2016-05-19 ENCOUNTER — Encounter (HOSPITAL_COMMUNITY): Payer: Self-pay | Admitting: Emergency Medicine

## 2016-05-19 ENCOUNTER — Inpatient Hospital Stay (HOSPITAL_COMMUNITY)
Admission: EM | Admit: 2016-05-19 | Discharge: 2016-05-25 | DRG: 975 | Disposition: A | Discharge status: Skilled Nursing Facility | Payer: PRIVATE HEALTH INSURANCE | Attending: Pulmonary Disease | Admitting: Pulmonary Disease

## 2016-05-19 ENCOUNTER — Ambulatory Visit (HOSPITAL_COMMUNITY): Payer: PRIVATE HEALTH INSURANCE

## 2016-05-19 DIAGNOSIS — I252 Old myocardial infarction: Secondary | ICD-10-CM | POA: Diagnosis not present

## 2016-05-19 DIAGNOSIS — B20 Human immunodeficiency virus [HIV] disease: Secondary | ICD-10-CM | POA: Diagnosis present

## 2016-05-19 DIAGNOSIS — I6930 Unspecified sequelae of cerebral infarction: Secondary | ICD-10-CM

## 2016-05-19 DIAGNOSIS — Z7982 Long term (current) use of aspirin: Secondary | ICD-10-CM | POA: Diagnosis not present

## 2016-05-19 DIAGNOSIS — E860 Dehydration: Secondary | ICD-10-CM | POA: Diagnosis present

## 2016-05-19 DIAGNOSIS — B3781 Candidal esophagitis: Principal | ICD-10-CM | POA: Diagnosis present

## 2016-05-19 DIAGNOSIS — R066 Hiccough: Secondary | ICD-10-CM | POA: Diagnosis not present

## 2016-05-19 DIAGNOSIS — I639 Cerebral infarction, unspecified: Secondary | ICD-10-CM

## 2016-05-19 DIAGNOSIS — I2510 Atherosclerotic heart disease of native coronary artery without angina pectoris: Secondary | ICD-10-CM | POA: Diagnosis present

## 2016-05-19 DIAGNOSIS — F0280 Dementia in other diseases classified elsewhere without behavioral disturbance: Secondary | ICD-10-CM | POA: Diagnosis not present

## 2016-05-19 DIAGNOSIS — G4700 Insomnia, unspecified: Secondary | ICD-10-CM | POA: Diagnosis present

## 2016-05-19 DIAGNOSIS — Z955 Presence of coronary angioplasty implant and graft: Secondary | ICD-10-CM | POA: Diagnosis not present

## 2016-05-19 DIAGNOSIS — K208 Other esophagitis without bleeding: Secondary | ICD-10-CM | POA: Diagnosis present

## 2016-05-19 DIAGNOSIS — E876 Hypokalemia: Secondary | ICD-10-CM | POA: Diagnosis present

## 2016-05-19 DIAGNOSIS — I69351 Hemiplegia and hemiparesis following cerebral infarction affecting right dominant side: Secondary | ICD-10-CM | POA: Diagnosis not present

## 2016-05-19 DIAGNOSIS — Z7902 Long term (current) use of antithrombotics/antiplatelets: Secondary | ICD-10-CM | POA: Diagnosis not present

## 2016-05-19 DIAGNOSIS — R079 Chest pain, unspecified: Secondary | ICD-10-CM

## 2016-05-19 DIAGNOSIS — B49 Unspecified mycosis: Secondary | ICD-10-CM | POA: Diagnosis present

## 2016-05-19 LAB — COMPREHENSIVE METABOLIC PANEL
ALBUMIN: 3.7 g/dL (ref 3.5–5.0)
ALK PHOS: 73 U/L (ref 38–126)
ALT: 38 U/L (ref 17–63)
ANION GAP: 6 (ref 5–15)
AST: 37 U/L (ref 15–41)
BILIRUBIN TOTAL: 0.7 mg/dL (ref 0.3–1.2)
BUN: 10 mg/dL (ref 6–20)
CALCIUM: 8.9 mg/dL (ref 8.9–10.3)
CO2: 25 mmol/L (ref 22–32)
Chloride: 107 mmol/L (ref 101–111)
Creatinine, Ser: 0.76 mg/dL (ref 0.61–1.24)
Glucose, Bld: 93 mg/dL (ref 65–99)
POTASSIUM: 3.8 mmol/L (ref 3.5–5.1)
Sodium: 138 mmol/L (ref 135–145)
TOTAL PROTEIN: 8.4 g/dL — AB (ref 6.5–8.1)

## 2016-05-19 LAB — CBC WITH DIFFERENTIAL/PLATELET
BASOS ABS: 0 10*3/uL (ref 0.0–0.1)
BASOS PCT: 0 %
Eosinophils Absolute: 0 10*3/uL (ref 0.0–0.7)
Eosinophils Relative: 0 %
HEMATOCRIT: 36.5 % — AB (ref 39.0–52.0)
HEMOGLOBIN: 12.4 g/dL — AB (ref 13.0–17.0)
LYMPHS PCT: 25 %
Lymphs Abs: 0.8 10*3/uL (ref 0.7–4.0)
MCH: 31.2 pg (ref 26.0–34.0)
MCHC: 34 g/dL (ref 30.0–36.0)
MCV: 91.9 fL (ref 78.0–100.0)
MONO ABS: 0.3 10*3/uL (ref 0.1–1.0)
Monocytes Relative: 10 %
NEUTROS ABS: 2 10*3/uL (ref 1.7–7.7)
NEUTROS PCT: 65 %
Platelets: 102 10*3/uL — ABNORMAL LOW (ref 150–400)
RBC: 3.97 MIL/uL — AB (ref 4.22–5.81)
RDW: 14 % (ref 11.5–15.5)
WBC: 3.1 10*3/uL — AB (ref 4.0–10.5)

## 2016-05-19 LAB — I-STAT TROPONIN, ED: Troponin i, poc: 0 ng/mL (ref 0.00–0.08)

## 2016-05-19 LAB — HIV-1/2 AB - DIFFERENTIATION
HIV 1 AB: POSITIVE — AB
HIV 2 AB: NEGATIVE

## 2016-05-19 LAB — TSH: TSH: 0.349 u[IU]/mL — ABNORMAL LOW (ref 0.350–4.500)

## 2016-05-19 MED ORDER — PROMETHAZINE HCL 12.5 MG PO TABS
12.5000 mg | ORAL_TABLET | Freq: Four times a day (QID) | ORAL | Status: DC | PRN
  Administered 2016-05-20 – 2016-05-24 (×4): 12.5 mg via ORAL
  Filled 2016-05-19 (×4): qty 1

## 2016-05-19 MED ORDER — CLOPIDOGREL BISULFATE 75 MG PO TABS
75.0000 mg | ORAL_TABLET | Freq: Every day | ORAL | Status: DC
  Administered 2016-05-20 – 2016-05-25 (×6): 75 mg via ORAL
  Filled 2016-05-19 (×6): qty 1

## 2016-05-19 MED ORDER — HEPARIN SODIUM (PORCINE) 5000 UNIT/ML IJ SOLN
5000.0000 [IU] | Freq: Three times a day (TID) | INTRAMUSCULAR | Status: DC
  Administered 2016-05-19 – 2016-05-25 (×15): 5000 [IU] via SUBCUTANEOUS
  Filled 2016-05-19 (×14): qty 1

## 2016-05-19 MED ORDER — CHLORPROMAZINE HCL 10 MG PO TABS
10.0000 mg | ORAL_TABLET | Freq: Three times a day (TID) | ORAL | Status: DC | PRN
Start: 2016-05-19 — End: 2016-05-25
  Administered 2016-05-20 – 2016-05-24 (×7): 10 mg via ORAL
  Filled 2016-05-19 (×8): qty 1

## 2016-05-19 MED ORDER — FOLIC ACID 5 MG/ML IJ SOLN
INTRAMUSCULAR | Status: AC
  Filled 2016-05-19: qty 0.2

## 2016-05-19 MED ORDER — THIAMINE HCL 100 MG/ML IJ SOLN
INTRAMUSCULAR | Status: AC
  Filled 2016-05-19: qty 2

## 2016-05-19 MED ORDER — M.V.I. ADULT IV INJ
INJECTION | INTRAVENOUS | Status: AC
  Filled 2016-05-19: qty 10

## 2016-05-19 MED ORDER — KCL IN DEXTROSE-NACL 20-5-0.9 MEQ/L-%-% IV SOLN
INTRAVENOUS | Status: DC
  Administered 2016-05-19: 125 mL/h via INTRAVENOUS
  Administered 2016-05-21: 02:00:00 via INTRAVENOUS

## 2016-05-19 MED ORDER — CHLORPROMAZINE HCL 25 MG/ML IJ SOLN
50.0000 mg | Freq: Once | INTRAMUSCULAR | Status: AC
  Administered 2016-05-19: 50 mg via INTRAMUSCULAR
  Filled 2016-05-19: qty 2

## 2016-05-19 MED ORDER — FLUCONAZOLE IN SODIUM CHLORIDE 200-0.9 MG/100ML-% IV SOLN
INTRAVENOUS | Status: AC
  Filled 2016-05-19: qty 100

## 2016-05-19 MED ORDER — FLUCONAZOLE IN SODIUM CHLORIDE 100-0.9 MG/50ML-% IV SOLN
100.0000 mg | INTRAVENOUS | Status: DC
  Administered 2016-05-19: 100 mg via INTRAVENOUS
  Filled 2016-05-19: qty 50

## 2016-05-19 MED ORDER — THIAMINE HCL 100 MG/ML IJ SOLN
Freq: Once | INTRAVENOUS | Status: AC
  Administered 2016-05-20: 01:00:00 via INTRAVENOUS
  Filled 2016-05-19: qty 1000

## 2016-05-19 MED ORDER — LORAZEPAM 2 MG/ML IJ SOLN
1.0000 mg | Freq: Once | INTRAMUSCULAR | Status: AC
  Administered 2016-05-19: 1 mg via INTRAVENOUS
  Filled 2016-05-19: qty 1

## 2016-05-19 MED ORDER — ASPIRIN 81 MG PO CHEW
81.0000 mg | CHEWABLE_TABLET | Freq: Every morning | ORAL | Status: DC
  Administered 2016-05-20 – 2016-05-25 (×6): 81 mg via ORAL
  Filled 2016-05-19 (×6): qty 1

## 2016-05-19 MED ORDER — SODIUM CHLORIDE 0.9 % IV BOLUS (SEPSIS)
1000.0000 mL | Freq: Once | INTRAVENOUS | Status: AC
  Administered 2016-05-19: 1000 mL via INTRAVENOUS

## 2016-05-19 NOTE — ED Notes (Signed)
MD at bedside. 

## 2016-05-19 NOTE — H&P (Signed)
History and Physical    Joseph Walton ZMO:[id number] DOB: [birth year] DOA: 05/19/2016  Referring MD/NP/PA: Alona Bene, MD  PCP: Fredirick Maudlin, MD Outpatient Specialists:  Patient coming from: Home  Chief Complaint: Hiccup   HPI: Joseph Walton is a 50 y.o. male with medical history significant of MI, CVA and recent HIV diagnosis presents with complaints of consistent intractable hiccups, chest pain, and inability to sleep for three days. He also reports mild pain with swallowing. Patient was seen in the ED 7/23 for the hiccups and chest pain. During exam he was noted to have oral candidiasis and was treated with Thorazine and discharged. Patient returned today and reports mild relief from medication but hiccups have returned and worsened. Of note patient was also found to be HIV positive during 7/23 ED visit. He has been referred to ID for further treatment. Family bedside states that he is having mild memory loss issues for the last months or so. Patient lives with his mother and father for the past 6 weeks and they report that his mental and cognitive abilities have been declining. He has told his family of his HIV diagnosis, and is getting support from his family.  Due to his progressive decline, plan was for him to go to a SNF facility as well.  We know not of his CD4 count.   ED Course: CMP, CBC, and BMP unremarkable. Due to severity of acute hiccups he has been referred for admission for further management.  Review of Systems: As per HPI otherwise 10 point review of systems negative.   Past Medical History:  Diagnosis Date  . MI (myocardial infarction) (HCC)   . Stroke [facility])     Past Surgical History:  Procedure Laterality Date  . BRAIN SURGERY    . CARDIAC CATHETERIZATION    . cardiac stents       reports that he has never smoked. He has never used smokeless tobacco. He reports that he does not drink alcohol or use drugs.  No Known Allergies  History reviewed.  No pertinent family history.   Prior to Admission medications   Medication Sig Start Date End Date Taking? Authorizing Provider  aspirin 81 MG tablet Take 81 mg by mouth every morning.   Yes Historical Provider, MD  atorvastatin (LIPITOR) 80 MG tablet Take 40 mg by mouth every morning. 05/12/16  Yes Historical Provider, MD  chlorproMAZINE (THORAZINE) 10 MG tablet Take 1 tablet (10 mg total) by mouth 3 (three) times daily as needed for hiccoughs. 05/17/16  Yes Margarita Grizzle, MD  clopidogrel (PLAVIX) 75 MG tablet Take 75 mg by mouth every morning. 05/12/16  Yes Historical Provider, MD  diphenhydramine-acetaminophen (TYLENOL PM) 25-500 MG TABS tablet Take 2 tablets by mouth at bedtime as needed (for sleep).   Yes Historical Provider, MD  fluconazole (DIFLUCAN) 200 MG tablet Take 1 tablet (200 mg total) by mouth 2 (two) times daily. 05/17/16 06/07/16 Yes Margarita Grizzle, MD  Potassium Chloride ER 20 MEQ TBCR Take 20 mEq by mouth every morning. 05/14/16  Yes Historical Provider, MD    Physical Exam: Vitals:   05/19/16 1231 05/19/16 1620 05/19/16 1700 05/19/16 1730  BP: 109/73 118/73 104/85 111/92  Pulse: 98 75 89 87  Resp: 16 20 22 20   Temp: 98.8 F (37.1 C)     TempSrc: Oral     SpO2: 100% 100% 99% 100%  Weight: 74.8 kg (165 lb)     Height: 5\' 10"  (1.778 m)  Vitals:   05/19/16 1231 05/19/16 1620 05/19/16 1700 05/19/16 1730  BP: 109/73 118/73 104/85 111/92  Pulse: 98 75 89 87  Resp: Temp: 98.8 F (37.1 C)     TempSrc: Oral     SpO2: 100% 100% 99% 100%  Weight: 74.8 kg (165 lb)     Height:  (1.778 m)      Constitutional: NAD, calm, comfortable Eyes: PERRL, lids and conjunctivae normal ENMT: Mucous membranes are moist. Posterior pharynx exudate or lesions.Normal dentition.  Neck: normal, supple, no masses, no thyromegaly Respiratory: clear to auscultation bilaterally, no wheezing, no crackles. Normal respiratory effort. No accessory muscle use.    Cardiovascular: Regular rate and rhythm, no murmurs / rubs / gallops. No extremity edema. 2+ pedal pulses. No carotid bruits.  Abdomen: no tenderness, no masses palpated. No hepatosplenomegaly. Bowel sounds positive.  Musculoskeletal: no clubbing / cyanosis. No joint deformity upper and lower extremities. Good ROM, no contractures. Normal muscle tone.  Skin: no rashes, lesions, ulcers. No induration Neurologic: CN 2-12 grossly intact. Sensation intact, DTR normal. Strength 5/5 in all 4. His speech is slurred.  Psychiatric: Normal judgment and insight. Alert and oriented x 3. Normal mood.   Labs on Admission: I have personally reviewed following labs and imaging studies  CBC:  Recent Labs Lab 05/17/16 1631 05/19/16 1729  WBC 2.9* 3.1*  NEUTROABS  --  2.0  HGB 12.2* 12.4*  HCT 36.5* 36.5*  MCV 92.4 91.9  PLT 97* 102*   Basic Metabolic Panel:  Recent Labs Lab 05/17/16 1631 05/19/16 1729  NA 141 138  K 3.6 3.8  CL 110 107  CO2 26 25  GLUCOSE 83 93  BUN 8 10  CREATININE 0.83 0.76  CALCIUM 8.9 8.9   Liver Function Tests:  Recent Labs Lab 05/17/16 1631 05/19/16 1729  AST 50* 37  ALT 48 38  ALKPHOS 76 73  BILITOT 0.6 0.7  PROT 8.3* 8.4*  ALBUMIN 3.6 3.7   Urine analysis:    Component Value Date/Time   COLORURINE AMBER BIOCHEMICALS MAY BE AFFECTED BY COLOR (A) 10/01/2007 1324   APPEARANCEUR CLEAR 10/01/2007 1324   LABSPEC 1.035 (H) 10/01/2007 1324   PHURINE 6.5 10/01/2007 1324   GLUCOSEU NEGATIVE 10/01/2007 1324   HGBUR NEGATIVE 10/01/2007 1324   BILIRUBINUR NEGATIVE 10/01/2007 1324   KETONESUR 15 (A) 10/01/2007 1324   PROTEINUR 30 (A) 10/01/2007 1324   UROBILINOGEN 1.0 10/01/2007 1324   NITRITE NEGATIVE 10/01/2007 1324   LEUKOCYTESUR NEGATIVE 10/01/2007 1324      EKG: Independently reviewed.   1/ Esophageal Candidiasis:  Suspect he has candida infection.  He can't really take the Diflucan orally.  Will give him IV Diflucan and IVF for now.    2/   Hiccup:  Will continue with IV Ativan PRN and IV phenothiazine for both hiccups and nausea.    3/  HIV:  Have admitted him for facilitation of work up.  I will check RPR, B12, TSH.   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  He has prior CVA, and will continue with ASA and Plavix.  Will obtain CD4 counts, and cardiac ECHO.  Will likely need HAART regimen soon.  4/  Dehydration:  Will need IVF.    DVT prophylaxis: Sub Q heparin.  Code Status: FULL CODE.  Family Communication: Mother, father, and sister bedside.   Disposition Plan: Family expressed desire for placement.   Consults called: None Admission status:  Inpatient.    Houston Siren MD FACP.  [facility] If 7PM-7AM, please contact night-coverage [email or web address] Password [email or web address]  05/19/2016, 7:22 PM   By signing my name below, I, Zadie Cleverly, attest that this documentation has been prepared under the direction and in the presence of Houston Siren, MD. Electronically signed: Zadie Cleverly, Scribe. 05/19/16 7:39pm  I, Dr. Houston Siren, personally performed the services described in this documentation. All medical record entries made by the scribe were at my discretion and in my presence. Houston Siren, MD 05/19/16

## 2016-05-19 NOTE — ED Provider Notes (Signed)
Emergency Department Provider Note   I have reviewed the triage vital signs and the nursing notes.   HISTORY  Chief Complaint Hiccups   HPI Joseph Walton is a 50 y.o. male with PMH of AMI and CVA presents to the emergency department for evaluation of intractable hiccups, chest pain, and no sleep for 3 days. The patient was seen and treated in the emergency department last night for this issue. He was given Thorazine which improved symptoms and was discharged home. He was found to be HIV positive with oral candidiasis noted on exam. He was started on Fluconazole. This prompted an HIV test which came back positive. The patient does not wish for his HIV status to be shared with family. He was referred to infectious disease for further treatment. Family states that on the drive home his hiccups returned and have been constant for almost the last 3 days. He reports some new onset chest discomfort since arriving to the emergency department waiting room. No difficulty breathing. No fevers.   Past Medical History:  Diagnosis Date  . MI (myocardial infarction) (HCC)   . Stroke [facility])     There are no active problems to display for this patient.   Past Surgical History:  Procedure Laterality Date  . BRAIN SURGERY    . CARDIAC CATHETERIZATION    . cardiac stents      Current Outpatient Rx  . Order #: [id number] Class: Historical Med  . Order #: [id number] Class: Historical Med  . Order #: [id number] Class: Print  . Order #: [id number] Class: Historical Med  . Order #: [id number] Class: Historical Med  . Order #: [id number] Class: Print  . Order #: [id number] Class: Historical Med    Allergies Review of patient's allergies indicates no known allergies.  History reviewed. No pertinent family history.  Social History Social History  Substance Use Topics  . Smoking status: Never Smoker  . Smokeless tobacco: Never Used  . Alcohol use No    Review of Systems  Constitutional: No  fever/chills Eyes: No visual changes. ENT: No sore throat. Cardiovascular: Positive chest pain. Respiratory: Denies shortness of breath. Gastrointestinal: No abdominal pain.  No nausea, no vomiting.  No diarrhea.  No constipation. 3 days of constant hiccups. Genitourinary: Negative for dysuria. Musculoskeletal: Negative for back pain. Skin: Negative for rash. Neurological: Negative for headaches, focal weakness or numbness.  10-point ROS otherwise negative.  ____________________________________________   PHYSICAL EXAM:  VITAL SIGNS: ED Triage Vitals  Enc Vitals Group     BP 05/19/16 1231 109/73     Pulse Rate 05/19/16 1231 98     Resp 05/19/16 1231 16     Temp 05/19/16 1231 98.8 F (37.1 C)     Temp Source 05/19/16 1231 Oral     SpO2 05/19/16 1231 100 %     Weight 05/19/16 1231 165 lb (74.8 kg)     Height 05/19/16 1231 5\' 10"  (1.778 m)   Constitutional: Alert and oriented. Significant difficulty with speech. Persistent hiccups.  Eyes: Conjunctivae are normal. PERRL. Head: Atraumatic. Nose: No congestion/rhinnorhea. Mouth/Throat: Mucous membranes are moist.  Oropharynx non-erythematous with scant white plaques.  Neck: No stridor.   Cardiovascular: Normal rate, regular rhythm. Good peripheral circulation. Grossly normal heart sounds.   Respiratory: Normal respiratory effort.  No retractions. Lungs CTAB. Gastrointestinal: Soft and nontender. No distention.  Musculoskeletal: No lower extremity tenderness nor edema. No gross deformities of extremities. Neurologic:  Normal speech and language. Left arm and leg weakness with facial asymmetry.  Skin:  Skin is warm, dry and intact. No rash noted. Psychiatric: Mood and affect are normal. Speech and behavior are normal.  ____________________________________________   LABS (all labs ordered are listed, but only abnormal results are displayed)  Labs Reviewed  COMPREHENSIVE METABOLIC PANEL - Abnormal; Notable for the following:         Result Value   Total Protein 8.4 (*)    All other components within normal limits  CBC WITH DIFFERENTIAL/PLATELET - Abnormal; Notable for the following:    WBC 3.1 (*)    RBC 3.97 (*)    Hemoglobin 12.4 (*)    HCT 36.5 (*)    Platelets 102 (*)    All other components within normal limits  TSH - Abnormal; Notable for the following:    TSH 0.349 (*)    All other components within normal limits  COMPREHENSIVE METABOLIC PANEL - Abnormal; Notable for the following:    Potassium 3.3 (*)    Glucose, Bld 104 (*)    Calcium 8.5 (*)    Albumin 3.3 (*)    All other components within normal limits  CBC - Abnormal; Notable for the following:    RBC 3.34 (*)    Hemoglobin 10.5 (*)    HCT 30.7 (*)    Platelets 88 (*)    All other components within normal limits  VITAMIN B12  CD4/CD8 (T-HELPER/T-SUPPRESSOR CELL)  RPR  HEPATITIS A ANTIBODY, TOTAL  HEPATITIS B CORE ANTIBODY, TOTAL  HEPATITIS B SURFACE ANTIGEN  HEPATITIS C ANTIBODY (REFLEX)  HIV-1 RNA ULTRAQUANT REFLEX TO GENTYP+  I-STAT TROPOININ, ED   ____________________________________________  EKG  Reviewed. No STEMI.  ____________________________________________  RADIOLOGY  None ____________________________________________   PROCEDURES  Procedure(s) performed:   Procedures  None ____________________________________________   INITIAL IMPRESSION / ASSESSMENT AND PLAN / ED COURSE  Pertinent labs & imaging results that were available during my care of the patient were reviewed by me and considered in my medical decision making (see chart for details).  Patient resents to the emergency department for evaluation of intractable hiccups. He responded to Thorazine yesterday although only briefly. Patient now with chest discomfort since arriving to the emergency department. He does have a history of acute MI. We'll initiate further lab evaluation including troponin, chemistry, CBC. We will begin treatment with Thorazine  to try and eliminate patient's discomfort. Patient has a somewhat unusual medical history with early stroke and heart attack 10 years ago. Did not disclose the patient's HIV status to family per his request in the emergency department yesterday.   Discussed case with the hospitalist regarding admission. Patient with multiple medical co-morbidities including newly diagnosed HIV disease and large CVA with significant neuro deficits. Unable to control patient's symptoms in the ED. Will admit for further symptom control, biomarker trending, and case manager evaluation. Updated patient and family who are pleased with the plan.  ____________________________________________  FINAL CLINICAL IMPRESSION(S) / ED DIAGNOSES  Final diagnoses:  Chest pain, unspecified chest pain type     MEDICATIONS GIVEN DURING THIS VISIT:  Medications  aspirin chewable tablet 81 mg (not administered)  chlorproMAZINE (THORAZINE) tablet 10 mg (not administered)  clopidogrel (PLAVIX) tablet 75 mg (not administered)  heparin injection 5,000 Units (5,000 Units Subcutaneous Given 05/20/16 0617)  dextrose 5 % and 0.9 % NaCl with KCl 20 mEq/L infusion (125 mL/hr Intravenous New Bag/Given 05/19/16 2246)  promethazine (PHENERGAN) tablet 12.5 mg (12.5 mg Oral Given 05/20/16 0658)  potassium chloride 10 mEq in 100 mL IVPB (10 mEq Intravenous  Given 05/20/16 1007)  fluconazole (DIFLUCAN) IVPB 400 mg (not administered)  sulfamethoxazole-trimethoprim (BACTRIM DS,SEPTRA DS) 800-160 MG per tablet 1 tablet (not administered)  dolutegravir (TIVICAY) tablet 50 mg (not administered)  emtricitabine-tenofovir AF (DESCOVY) 200-25 MG per tablet 1 tablet (not administered)  chlorproMAZINE (THORAZINE) injection 50 mg (50 mg Intramuscular Given 05/19/16 1733)  sodium chloride 0.9 % bolus 1,000 mL (0 mLs Intravenous Stopped 05/19/16 1946)  LORazepam (ATIVAN) injection 1 mg (1 mg Intravenous Given 05/19/16 1945)  sodium chloride 0.9 % 1,000 mL with  thiamine 100 mg, folic acid 1 mg, multivitamins adult 10 mL infusion ( Intravenous New Bag/Given 05/20/16 0036)     NEW OUTPATIENT MEDICATIONS STARTED DURING THIS VISIT:  None   Note:  This document was prepared using Dragon voice recognition software and may include unintentional dictation errors.  Alona Bene, MD Emergency Medicine   Maia Plan, MD 05/20/16 [phone]

## 2016-05-19 NOTE — ED Triage Notes (Signed)
PT presenting today by EMS to ED for persistent hiccups x3 days. PT states the hiccups are now causing him to have muscle aches.

## 2016-05-20 ENCOUNTER — Telehealth (HOSPITAL_BASED_OUTPATIENT_CLINIC_OR_DEPARTMENT_OTHER): Payer: Self-pay | Admitting: Emergency Medicine

## 2016-05-20 LAB — COMPREHENSIVE METABOLIC PANEL
ALK PHOS: 68 U/L (ref 38–126)
ALT: 32 U/L (ref 17–63)
AST: 33 U/L (ref 15–41)
Albumin: 3.3 g/dL — ABNORMAL LOW (ref 3.5–5.0)
Anion gap: 6 (ref 5–15)
BUN: 9 mg/dL (ref 6–20)
CALCIUM: 8.5 mg/dL — AB (ref 8.9–10.3)
CO2: 23 mmol/L (ref 22–32)
CREATININE: 0.7 mg/dL (ref 0.61–1.24)
Chloride: 111 mmol/L (ref 101–111)
Glucose, Bld: 104 mg/dL — ABNORMAL HIGH (ref 65–99)
Potassium: 3.3 mmol/L — ABNORMAL LOW (ref 3.5–5.1)
Sodium: 140 mmol/L (ref 135–145)
TOTAL PROTEIN: 7.6 g/dL (ref 6.5–8.1)
Total Bilirubin: 0.6 mg/dL (ref 0.3–1.2)

## 2016-05-20 LAB — VITAMIN B12: VITAMIN B 12: 223 pg/mL (ref 180–914)

## 2016-05-20 LAB — CBC
HCT: 30.7 % — ABNORMAL LOW (ref 39.0–52.0)
Hemoglobin: 10.5 g/dL — ABNORMAL LOW (ref 13.0–17.0)
MCH: 31.4 pg (ref 26.0–34.0)
MCHC: 34.2 g/dL (ref 30.0–36.0)
MCV: 91.9 fL (ref 78.0–100.0)
PLATELETS: 88 10*3/uL — AB (ref 150–400)
RBC: 3.34 MIL/uL — AB (ref 4.22–5.81)
RDW: 13.9 % (ref 11.5–15.5)
WBC: 5.3 10*3/uL (ref 4.0–10.5)

## 2016-05-20 MED ORDER — FLUCONAZOLE IN SODIUM CHLORIDE 400-0.9 MG/200ML-% IV SOLN
400.0000 mg | INTRAVENOUS | Status: DC
  Administered 2016-05-20 – 2016-05-22 (×3): 400 mg via INTRAVENOUS
  Filled 2016-05-20 (×5): qty 200

## 2016-05-20 MED ORDER — EMTRICITABINE-TENOFOVIR AF 200-25 MG PO TABS
1.0000 | ORAL_TABLET | Freq: Every day | ORAL | Status: DC
  Administered 2016-05-20 – 2016-05-25 (×6): 1 via ORAL
  Filled 2016-05-20 (×8): qty 1

## 2016-05-20 MED ORDER — DOLUTEGRAVIR SODIUM 50 MG PO TABS
50.0000 mg | ORAL_TABLET | Freq: Every day | ORAL | Status: DC
  Administered 2016-05-20 – 2016-05-25 (×6): 50 mg via ORAL
  Filled 2016-05-20 (×8): qty 1

## 2016-05-20 MED ORDER — SULFAMETHOXAZOLE-TRIMETHOPRIM 800-160 MG PO TABS
1.0000 | ORAL_TABLET | Freq: Every day | ORAL | Status: DC
  Administered 2016-05-20 – 2016-05-25 (×6): 1 via ORAL
  Filled 2016-05-20 (×6): qty 1

## 2016-05-20 MED ORDER — POTASSIUM CHLORIDE 10 MEQ/100ML IV SOLN
10.0000 meq | INTRAVENOUS | Status: AC
Start: 2016-05-20 — End: 2016-05-20
  Administered 2016-05-20 (×6): 10 meq via INTRAVENOUS
  Filled 2016-05-20 (×6): qty 100

## 2016-05-20 NOTE — Evaluation (Signed)
Physical Therapy Evaluation Patient Details Name: Joseph Walton MRN: [id number] DOB: 12-18-1965 Today's Date: 05/20/2016   History of Present Illness  50 yo M admitted 05/19/2016 with consistent intractable hiccups, chest pain, and inability to sleep for 3 days.  Patient was seen in the ED 7/23 for the hiccups and chest pain. During exam he was noted to have oral candidiasis and was treated with Thorazine and discharged.  During this ED visit he was also diagnosed with HIV.  Hiccups have now returned.  Family reports mild memory loss over the last few months.  PMH: MI, CVA, recent HIV diagnosis, brain surgery, cardiac stents  Clinical Impression  Pt received in bed, and was agreeable to PT evaluation.  Pt states that normally he lives with his parents, and his dad has been assisting him with dressing/bathing, and he has been ambulating with a RW and assistance from his dad.  During PT today, pt required Min A for static sitting balance due to significant lean to the right.  Pt required Max A for stand pivot transfer from the bed<>chair with RW - R knee buckled during transfer.  Pt also demonstrates significant R UE weakness - strongly recommend OT to assist with addressing this deficit.  At this point, he is recommended for SNF for continued strengthening, balance, and endurance.     Follow Up Recommendations SNF    Equipment Recommendations  None recommended by PT    Recommendations for Other Services OT consult     Precautions / Restrictions Precautions Precautions: Fall Precaution Comments: Pt reports falling - not able to specify Restrictions Weight Bearing Restrictions: No      Mobility  Bed Mobility Overal bed mobility: Needs Assistance Bed Mobility: Supine to Sit     Supine to sit: Mod assist;HOB elevated     General bed mobility comments: Pt states that someone normally pulls him up out of bed.  Pt also needed assistance to advance R LE off the side of the bed.  Pt  required Min A for static sitting balance due to signficant R sided lean.    Transfers Overall transfer level: Needs assistance Equipment used: Rolling walker (2 wheeled) Transfers: Sit to/from [employer] Sit to Stand: Max assist Stand pivot transfers: Max assist       General transfer comment: Moderated R sided lean in standing.  Pt demonstrates significant R knee buckle when attempting to transfer bed<>chair.    Ambulation/Gait                Stairs            Wheelchair Mobility    Modified Rankin (Stroke Patients Only)       Balance Overall balance assessment: Needs assistance Sitting-balance support: Bilateral upper extremity supported Sitting balance-Leahy Scale: Poor Sitting balance - Comments: Pt required Min A for static sitting balance due to R sided lean.  Pt instructed on holding onto the foot of the bed on the left to maintain balance.   Postural control: Right lateral lean                                   Pertinent Vitals/Pain Pain Assessment: No/denies pain    Home Living   Living Arrangements: Parent (mom and dad )   Type of Home: House Home Access: Stairs to enter Entrance Stairs-Rails: Can reach both Entrance Stairs-Number of Steps: 4 in the back Home Layout:  One level Home Equipment: Walker - 2 wheels      Prior Function     Gait / Transfers Assistance Needed: Pt states that someone has to be with hime when he is up and walking - usually his dad. Pt states he uses a walker.   ADL's / Homemaking Assistance Needed: Pt states that his dad helps him dress and bathe        Hand Dominance        Extremity/Trunk Assessment   Upper Extremity Assessment: RUE deficits/detail RUE Deficits / Details: shoulder flexion: 1/5, elbow flexion: 2/5, minimal grip strength.           Lower Extremity Assessment: RLE deficits/detail RLE Deficits / Details: hip flexion: 3/5, knee extension: 2/5, ankle DF/PF:  3/5.  Poor coordination with movement.         Communication   Communication: Expressive difficulties  Cognition Arousal/Alertness: Awake/alert Behavior During Therapy: WFL for tasks assessed/performed Overall Cognitive Status: No family/caregiver present to determine baseline cognitive functioning                      General Comments      Exercises        Assessment/Plan    PT Assessment Patient needs continued PT services  PT Diagnosis Difficulty walking;Abnormality of gait;Generalized weakness;Hemiplegia dominant side   PT Problem List Decreased strength;Decreased range of motion;Decreased activity tolerance;Decreased balance;Decreased mobility;Decreased coordination;Decreased cognition;Decreased knowledge of use of DME;Decreased safety awareness;Decreased knowledge of precautions;Impaired tone  PT Treatment Interventions DME instruction;Gait training;Functional mobility training;Therapeutic activities;Therapeutic exercise;Balance training;Neuromuscular re-education;Cognitive remediation;Patient/family education   PT Goals (Current goals can be found in the Care Plan section) Acute Rehab PT Goals Patient Stated Goal: Pt wants to get stronger PT Goal Formulation: With patient Time For Goal Achievement: 05/27/16 Potential to Achieve Goals: Good    Frequency 7X/week   Barriers to discharge Inaccessible home environment;Decreased caregiver support      Co-evaluation               End of Session Equipment Utilized During Treatment: Gait belt Activity Tolerance: Patient tolerated treatment well Patient left: in chair;with call bell/phone within reach           Time: 1001-1035 PT Time Calculation (min) (ACUTE ONLY): 34 min   Charges:   PT Evaluation $PT Eval Moderate Complexity: 1 Procedure PT Treatments $Therapeutic Activity: 8-22 mins   PT G Codes:        Beth Norwin Aleman, PT, DPT X: [phone]

## 2016-05-20 NOTE — Progress Notes (Signed)
Subjective: He is very difficult to understand. He was brought into the hospital last night with Candida esophagitis previous stroke and HIV/AIDS. I discussed his situation with Dr. Linus Salmons from infectious disease this morning.  Objective: Vital signs in last 24 hours: Temp:  [97.6 F (36.4 C)-98.8 F (37.1 C)] 98.7 F (37.1 C) (07/26 0620) Pulse Rate:  [75-107] 107 (07/26 0620) Resp:  [16-22] 20 (07/26 0620) BP: (104-127)/(73-92) 127/83 (07/26 0620) SpO2:  [97 %-100 %] 97 % (07/26 0620) Weight:  [72.1 kg (159 lb)-74.8 kg (165 lb)] 72.1 kg (159 lb) (07/25 2131) Weight change:     Intake/Output from previous day: 07/25 0701 - 07/26 0700 In: 904.2 [I.V.:904.2] Out: 200 [Urine:200]  PHYSICAL EXAM General appearance: alert and Speech is very difficult to understand and he has right hemiparesis Resp: clear to auscultation bilaterally Cardio: regular rate and rhythm, S1, S2 normal, no murmur, click, rub or gallop GI: soft, non-tender; bowel sounds normal; no masses,  no organomegaly Extremities: extremities normal, atraumatic, no cyanosis or edema  Lab Results:  Results for orders placed or performed during the hospital encounter of 05/19/16 (from the past 48 hour(s))  Comprehensive metabolic panel     Status: Abnormal   Collection Time: 05/19/16  5:29 PM  Result Value Ref Range   Sodium 138 135 - 145 mmol/L   Potassium 3.8 3.5 - 5.1 mmol/L   Chloride 107 101 - 111 mmol/L   CO2 25 22 - 32 mmol/L   Glucose, Bld 93 65 - 99 mg/dL   BUN 10 6 - 20 mg/dL   Creatinine, Ser 0.76 0.61 - 1.24 mg/dL   Calcium 8.9 8.9 - 10.3 mg/dL   Total Protein 8.4 (H) 6.5 - 8.1 g/dL   Albumin 3.7 3.5 - 5.0 g/dL   AST 37 15 - 41 U/L   ALT 38 17 - 63 U/L   Alkaline Phosphatase 73 38 - 126 U/L   Total Bilirubin 0.7 0.3 - 1.2 mg/dL   GFR calc non Af Amer >60 >60 mL/min   GFR calc Af Amer >60 >60 mL/min    Comment: (NOTE) The eGFR has been calculated using the CKD EPI equation. This calculation has not  been validated in all clinical situations. eGFR's persistently <60 mL/min signify possible Chronic Kidney Disease.    Anion gap 6 5 - 15  CBC with Differential     Status: Abnormal   Collection Time: 05/19/16  5:29 PM  Result Value Ref Range   WBC 3.1 (L) 4.0 - 10.5 K/uL   RBC 3.97 (L) 4.22 - 5.81 MIL/uL   Hemoglobin 12.4 (L) 13.0 - 17.0 g/dL   HCT 36.5 (L) 39.0 - 52.0 %   MCV 91.9 78.0 - 100.0 fL   MCH 31.2 26.0 - 34.0 pg   MCHC 34.0 30.0 - 36.0 g/dL   RDW 14.0 11.5 - 15.5 %   Platelets 102 (L) 150 - 400 K/uL    Comment: CONSISTENT WITH PREVIOUS RESULT SPECIMEN CHECKED FOR CLOTS    Neutrophils Relative % 65 %   Neutro Abs 2.0 1.7 - 7.7 K/uL   Lymphocytes Relative 25 %   Lymphs Abs 0.8 0.7 - 4.0 K/uL   Monocytes Relative 10 %   Monocytes Absolute 0.3 0.1 - 1.0 K/uL   Eosinophils Relative 0 %   Eosinophils Absolute 0.0 0.0 - 0.7 K/uL   Basophils Relative 0 %   Basophils Absolute 0.0 0.0 - 0.1 K/uL  TSH     Status: Abnormal  Collection Time: 05/19/16  5:30 PM  Result Value Ref Range   TSH 0.349 (L) 0.350 - 4.500 uIU/mL  I-stat troponin, ED     Status: None   Collection Time: 05/19/16  5:41 PM  Result Value Ref Range   Troponin i, poc 0.00 0.00 - 0.08 ng/mL   Comment 3            Comment: Due to the release kinetics of cTnI, a negative result within the first hours of the onset of symptoms does not rule out myocardial infarction with certainty. If myocardial infarction is still suspected, repeat the test at appropriate intervals.   Comprehensive metabolic panel     Status: Abnormal   Collection Time: 05/20/16  5:34 AM  Result Value Ref Range   Sodium 140 135 - 145 mmol/L   Potassium 3.3 (L) 3.5 - 5.1 mmol/L   Chloride 111 101 - 111 mmol/L   CO2 23 22 - 32 mmol/L   Glucose, Bld 104 (H) 65 - 99 mg/dL   BUN 9 6 - 20 mg/dL   Creatinine, Ser 0.70 0.61 - 1.24 mg/dL   Calcium 8.5 (L) 8.9 - 10.3 mg/dL   Total Protein 7.6 6.5 - 8.1 g/dL   Albumin 3.3 (L) 3.5 - 5.0 g/dL    AST 33 15 - 41 U/L   ALT 32 17 - 63 U/L   Alkaline Phosphatase 68 38 - 126 U/L   Total Bilirubin 0.6 0.3 - 1.2 mg/dL   GFR calc non Af Amer >60 >60 mL/min   GFR calc Af Amer >60 >60 mL/min    Comment: (NOTE) The eGFR has been calculated using the CKD EPI equation. This calculation has not been validated in all clinical situations. eGFR's persistently <60 mL/min signify possible Chronic Kidney Disease.    Anion gap 6 5 - 15  CBC     Status: Abnormal   Collection Time: 05/20/16  5:34 AM  Result Value Ref Range   WBC 5.3 4.0 - 10.5 K/uL   RBC 3.34 (L) 4.22 - 5.81 MIL/uL   Hemoglobin 10.5 (L) 13.0 - 17.0 g/dL   HCT 30.7 (L) 39.0 - 52.0 %   MCV 91.9 78.0 - 100.0 fL   MCH 31.4 26.0 - 34.0 pg   MCHC 34.2 30.0 - 36.0 g/dL   RDW 13.9 11.5 - 15.5 %   Platelets 88 (L) 150 - 400 K/uL    Comment: SPECIMEN CHECKED FOR CLOTS CONSISTENT WITH PREVIOUS RESULT     ABGS No results for input(s): PHART, PO2ART, TCO2, HCO3 in the last 72 hours.  Invalid input(s): PCO2 CULTURES No results found for this or any previous visit (from the past 240 hour(s)). Studies/Results: No results found.  Medications:  Prior to Admission:  Prescriptions Prior to Admission  Medication Sig Dispense Refill Last Dose  . aspirin 81 MG tablet Take 81 mg by mouth every morning.   05/19/2016 at Unknown time  . atorvastatin (LIPITOR) 80 MG tablet Take 40 mg by mouth every morning.  11 05/19/2016 at Unknown time  . chlorproMAZINE (THORAZINE) 10 MG tablet Take 1 tablet (10 mg total) by mouth 3 (three) times daily as needed for hiccoughs. 30 tablet 0 05/19/2016 at Unknown time  . clopidogrel (PLAVIX) 75 MG tablet Take 75 mg by mouth every morning.  11 05/19/2016 at Unknown time  . diphenhydramine-acetaminophen (TYLENOL PM) 25-500 MG TABS tablet Take 2 tablets by mouth at bedtime as needed (for sleep).   Past Month at Unknown time  .  fluconazole (DIFLUCAN) 200 MG tablet Take 1 tablet (200 mg total) by mouth 2 (two) times  daily. 7 tablet 0 05/19/2016 at Unknown time  . Potassium Chloride ER 20 MEQ TBCR Take 20 mEq by mouth every morning.  0 05/19/2016 at Unknown time   Scheduled: . aspirin  81 mg Oral q morning - 10a  . clopidogrel  75 mg Oral Daily  . dolutegravir  50 mg Oral Daily  . emtricitabine-tenofovir AF  1 tablet Oral Daily  . fluconazole (DIFLUCAN) IV  400 mg Intravenous Q24H  . heparin  5,000 Units Subcutaneous Q8H  . potassium chloride  10 mEq Intravenous Q1 Hr x 6  . sulfamethoxazole-trimethoprim  1 tablet Oral Daily   Continuous: . dextrose 5 % and 0.9 % NaCl with KCl 20 mEq/L 125 mL/hr (05/19/16 2246)   QLR:[id number], promethazine  Assesment:He was admitted with Candida esophagitis and HIV disease and this is an age defining illness. He had a stroke about a month ago. He has severe hiccups. His speech is severely garbled. He is listed with HIV dementia but I'm not totally sure that he has that versus the garbled speech and difficulty from his stroke although he may have HIV dementia. He has lost about 60 pounds. 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 I have discussed with case manager because I don't think he is going to be able to go home. He will start anti-retroviral therapy. I have ordered a CD4 count HIV 1 viral load HIV 1 reflex genotype hepatitis A total antibody level, hepatitis be antigen core total and hepatitis C antibody at the direction of infectious disease. He will start Bactrim for PCP prophylaxis.    LOS: 1 day   Ellayna Hilligoss L 05/20/2016, 8:40 AM

## 2016-05-20 NOTE — Evaluation (Signed)
Clinical/Bedside Swallow Evaluation Patient Details  Name: Joseph Walton MRN: [id number] Date of Birth: [birth year]  Today's Date: 05/20/2016 Time: SLP Start Time (ACUTE ONLY): 1900 SLP Stop Time (ACUTE ONLY): 1950 SLP Time Calculation (min) (ACUTE ONLY): 50 min  Past Medical History:  Past Medical History:  Diagnosis Date  . MI (myocardial infarction) (HCC)   . Stroke [facility])    Past Surgical History:  Past Surgical History:  Procedure Laterality Date  . BRAIN SURGERY    . CARDIAC CATHETERIZATION    . cardiac stents     HPI:  50 yo M admitted 05/19/2016 with consistent intractable hiccups, chest pain, and inability to sleep for 3 days.  Patient was seen in the ED 7/23 for the hiccups and chest pain. During exam he was noted to have oral candidiasis and was treated with Thorazine and discharged.  During this ED visit he was also diagnosed with HIV.  Hiccups have now returned.  Family reports mild memory loss over the last few months.  PMH: MI, CVA, recent HIV diagnosis, brain surgery, cardiac stents   Assessment / Plan / Recommendation Clinical Impression  Joseph Walton was seen at bedside for clinical swallow evaluation. Speech is characterized as dysarthric. His brother reports that his speech is typically better in the mornings and gets worse throughout the day. History is given to me by family: Pt's parents went to pt's home in [place] a couple of months ago because they noted he was slurring on the phone. Pt has been living with his parent for the past 2 months and family noted slurred speech, abnormal gait, falls, decreased cognition, coughing with meals, and finally hiccups. He had Head CT on 05/11/16 which showed: Late subacute or old cortical and subcortical infarction in the right posterior frontal lobe. Probable similar age infarction at the right lateral thalamus/posterior limb internal capsule. No sign of hemorrhage or mass effect. Pt also newly found to be HIV+. Pt with  right labial weakness and unable to swallow upon verbal request. Pt with delay in swallow initiation when presented with ice chips, thin, nectar, and pureed solids. Occasional strong coughing after straw sips thin and improved performance with controlled cup sips. Pt frequently tipped head straight back (neck flexed) and needed cues to bring head/chin to neutral position (assist with pillow). Suspect pt with neurogenic dysphagia (weakness) exacerbated by decreased cognition (awareness and attention) which increases his risk for aspiration. Cough is strong when elicited reflexively. Pt oriented to "April" and "Sunday", however he is able to follow commands with some delay. Recommend D3/mech soft with nectar-thick liquids via cup sips. NO straws and NO thin liquids. PO meds whole with NTL or puree. Pt will need cognitive linguistic evaluation and ongoing treatment in rehabilitation setting (SNF vs acute rehab).    Aspiration Risk  Moderate aspiration risk    Diet Recommendation Dysphagia 3 (Mech soft);Nectar-thick liquid   Liquid Administration via: Cup;No straw Medication Administration: Whole meds with liquid Supervision: Staff to assist with self feeding;Full supervision/cueing for compensatory strategies Compensations: Minimize environmental distractions;Slow rate;Small sips/bites (may have to verbally cue pt to "swallow" ) Postural Changes: Seated upright at 90 degrees;Remain upright for at least 30 minutes after po intake    Other  Recommendations Oral Care Recommendations: Oral care BID;Staff/trained caregiver to provide oral care Other Recommendations: Order thickener from pharmacy;Prohibited food (jello, ice cream, thin soups);Clarify dietary restrictions   Follow up Recommendations  Inpatient Rehab;Skilled Nursing facility    Frequency and Duration min 2x/week  1  week       Prognosis Prognosis for Safe Diet Advancement: Fair Barriers to Reach Goals: Cognitive deficits      Swallow  Study   General Date of Onset: 05/19/16 HPI: 50 yo M admitted 05/19/2016 with consistent intractable hiccups, chest pain, and inability to sleep for 3 days.  Patient was seen in the ED 7/23 for the hiccups and chest pain. During exam he was noted to have oral candidiasis and was treated with Thorazine and discharged.  During this ED visit he was also diagnosed with HIV.  Hiccups have now returned.  Family reports mild memory loss over the last few months.  PMH: MI, CVA, recent HIV diagnosis, brain surgery, cardiac stents Type of Study: Bedside Swallow Evaluation Previous Swallow Assessment: None on record Diet Prior to this Study: Regular;Thin liquids Temperature Spikes Noted: No Respiratory Status: Room air History of Recent Intubation: No Behavior/Cognition: Alert;Cooperative;Requires cueing;Lethargic/Drowsy;Pleasant mood Oral Cavity Assessment: Within Functional Limits Oral Care Completed by SLP: Yes Oral Cavity - Dentition: Adequate natural dentition Vision: Functional for self-feeding Self-Feeding Abilities: Needs assist Patient Positioning: Upright in bed (mod/max cues for head positioning) Baseline Vocal Quality: Normal;Low vocal intensity Volitional Cough: Strong;Congested Volitional Swallow: Unable to elicit    Oral/Motor/Sensory Function Overall Oral Motor/Sensory Function: Mild impairment Facial ROM: Reduced right;Suspected CN VII (facial) dysfunction Facial Symmetry: Abnormal symmetry right;Suspected CN VII (facial) dysfunction Facial Strength: Reduced right;Suspected CN VII (facial) dysfunction Facial Sensation: Within Functional Limits Lingual ROM: Within Functional Limits Lingual Symmetry: Within Functional Limits Lingual Strength: Reduced Lingual Sensation: Within Functional Limits Velum: Within Functional Limits Mandible: Within Functional Limits   Ice Chips Ice chips: Impaired Presentation: Spoon Oral Phase Impairments: Reduced lingual movement/coordination;Poor  awareness of bolus   Thin Liquid Thin Liquid: Impaired Presentation: Cup;Straw;Self Fed Oral Phase Impairments: Reduced lingual movement/coordination;Poor awareness of bolus Pharyngeal  Phase Impairments: Suspected delayed Swallow;Cough - Immediate Other Comments:  (variable coughing likely due to decreased attention and dela)    Nectar Thick Nectar Thick Liquid: Impaired Presentation: Cup;Self Fed;Straw Oral phase functional implications: Prolonged oral transit Pharyngeal Phase Impairments: Suspected delayed Swallow;Cough - Delayed   Honey Thick Honey Thick Liquid: Not tested   Puree Puree: Within functional limits Presentation: Spoon   Solid      Solid: Impaired Presentation: Self Fed Oral Phase Impairments: Reduced lingual movement/coordination Oral Phase Functional Implications: Prolonged oral transit;Oral holding;Oral residue Pharyngeal Phase Impairments: Suspected delayed Swallow       Thank you,  Joseph Walton, CCC-SLP [phone]  Rayshawn Visconti 05/20/2016,8:20 PM

## 2016-05-20 NOTE — Clinical Social Work Note (Signed)
Clinical Social Work Assessment  Patient Details  Name: Joseph Walton MRN: [id number] Date of Birth: [birth year]  Date of referral:  05/20/16               Reason for consult:  Facility Placement                Permission sought to share information with:    Permission granted to share information::     Name::        Agency::     Relationship::     Contact Information:  Parents who were at bedside.  Housing/Transportation Living arrangements for the past 2 months:  Single Family Home Source of Information:  Patient, Parent Patient Interpreter Needed:  None Criminal Activity/Legal Involvement Pertinent to Current Situation/Hospitalization:  No - Comment as needed Significant Relationships:  Parents Lives with:  Parents Do you feel safe going back to the place where you live?  Yes Need for family participation in patient care:  Yes (Comment)  Care giving concerns:  Patient's care needs are too great for parents who have been providing care for patient for the past two months.    Social Worker assessment / plan: Patient's parents were at bedside. They advised that patient has been living with them for approximately the past two months due to the impact of his stroke on his physical health. They advised that patient's needs were progressively increasing and they were becoming too great for them to manage. CSW discussed PT recommendation for SNF and provided list of area facilities. Mrs. Rommel advised that they were interested in [facility] or [facility]. CSW discussed patient's placement being contingent on locating a facility that accepted his insurance.  Parents advised that patient's last day at [employer] (where he was employed) was 5/31 and that patient was supposed to start at [facility] [employer] but due to his health issues he was unable to do so. Parents stated "we are not set up for this" referring to patient's care giving needs.    Employment status:  Unemployed Health and safety inspector:  Managed Care  (Patient's have been paying patient's COBRA.) PT Recommendations:  Skilled Nursing Facility Information / Referral to community resources:  Skilled Nursing Facility  Patient/Family's Response to care: Patient and family desire SNF.   Patient/Family's Understanding of and Emotional Response to Diagnosis, Current Treatment, and Prognosis:  Patient and family are aware and accepting of patient's diagnosis, treatment and prognosis.   Emotional Assessment Appearance:  Appears stated age Attitude/Demeanor/Rapport:   (Cooperative) Affect (typically observed):  Accepting, Calm Orientation:  Oriented to Self, Oriented to Place, Oriented to  Time, Oriented to Situation Alcohol / Substance use:  Not Applicable Psych involvement (Current and /or in the community):  No (Comment)  Discharge Needs  Concerns to be addressed:  Discharge Planning Concerns Readmission within the last 30 days:  No Current discharge risk:  Chronically ill, Physical Impairment Barriers to Discharge:  Other (Patient's insurance benefits are currently uncertain as it relates to SNF placement as he has [employer] and is on COBRA.)   Annice Needy, LCSW 05/20/2016, 12:17 PM

## 2016-05-20 NOTE — NC FL2 (Signed)
[place] MEDICAID FL2 LEVEL OF CARE SCREENING TOOL     IDENTIFICATION  Patient Name: Joseph Walton Birthdate: 1966/10/01 Sex: male Admission Date (Current Location): 05/19/2016  [facility] and [place] Number:  [employer] and Address:  [facility],  [address] [place] [address]      Provider Number: [id number]  Attending Physician Name and Address:  Kari Baars, MD  Relative Name and Phone Number:       Current Level of Care: Hospital Recommended Level of Care: Skilled Nursing Facility Prior Approval Number:    Date Approved/Denied:   PASRR Number: [id number]  Discharge Plan: SNF    Current Diagnoses: Patient Active Problem List   Diagnosis Date Noted  . Candida esophagitis (HCC) 05/19/2016  . HIV disease (HCC) 05/19/2016  . CVA (cerebral infarction) 05/19/2016  . 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 Fungal esophagitis 05/19/2016    Orientation RESPIRATION BLADDER Height & Weight     Self, Time, Situation, Place  Normal Continent Weight: 159 lb (72.1 kg) Height:   (177.8 cm)  BEHAVIORAL SYMPTOMS/MOOD NEUROLOGICAL BOWEL NUTRITION STATUS      Continent Diet (Diet Soft.)  AMBULATORY STATUS COMMUNICATION OF NEEDS Skin   Extensive Assist Verbally Normal                       Personal Care Assistance Level of Assistance  Bathing, Feeding, Dressing Bathing Assistance: Maximum assistance Feeding assistance: Maximum assistance Dressing Assistance: Maximum assistance     Functional Limitations Info  Sight, Hearing, Speech Sight Info: Adequate Hearing Info: Adequate Speech Info: Impaired (Slurred speech)    SPECIAL CARE FACTORS FREQUENCY  PT (By licensed PT)     PT Frequency: 5x/week              Contractures Contractures Info: Not present    Additional Factors Info  Code Status Code Status Info: Full Code             Current Medications (05/20/2016):  This is the current hospital active medication  list Current Facility-Administered Medications  Medication Dose Route Frequency Provider Last Rate Last Dose  . aspirin chewable tablet 81 mg  81 mg Oral q morning - 10a Houston Siren, MD   81 mg at 05/20/16 1051  . chlorproMAZINE (THORAZINE) tablet 10 mg  10 mg Oral TID PRN Houston Siren, MD      . clopidogrel (PLAVIX) tablet 75 mg  75 mg Oral Daily Houston Siren, MD   75 mg at 05/20/16 1051  . dextrose 5 % and 0.9 % NaCl with KCl 20 mEq/L infusion   Intravenous Continuous Houston Siren, MD 125 mL/hr at 05/19/16 2246 125 mL/hr at 05/19/16 2246  . dolutegravir (TIVICAY) tablet 50 mg  50 mg Oral Daily Kari Baars, MD   50 mg at 05/20/16 1051  . emtricitabine-tenofovir AF (DESCOVY) 200-25 MG per tablet 1 tablet  1 tablet Oral Daily Kari Baars, MD   1 tablet at 05/20/16 1051  . fluconazole (DIFLUCAN) IVPB 400 mg  400 mg Intravenous Q24H Kari Baars, MD      . heparin injection 5,000 Units  5,000 Units Subcutaneous Q8H Houston Siren, MD   5,000 Units at 05/20/16 0617  . potassium chloride 10 mEq in 100 mL IVPB  10 mEq Intravenous Q1 Hr x 6 Kari Baars, MD   10 mEq at 05/20/16 1204  . promethazine (PHENERGAN) tablet 12.5 mg  12.5 mg Oral Q6H PRN Houston Siren, MD  12.5 mg at 05/20/16 0658  . sulfamethoxazole-trimethoprim (BACTRIM DS,SEPTRA DS) 800-160 MG per tablet 1 tablet  1 tablet Oral Daily Kari Baars, MD   1 tablet at 05/20/16 1051     Discharge Medications: Please see discharge summary for a list of discharge medications.  Relevant Imaging Results:  Relevant Lab Results:   Additional Information    [name], [name], LCSW

## 2016-05-21 DIAGNOSIS — I6930 Unspecified sequelae of cerebral infarction: Secondary | ICD-10-CM

## 2016-05-21 DIAGNOSIS — B20 Human immunodeficiency virus [HIV] disease: Secondary | ICD-10-CM | POA: Diagnosis present

## 2016-05-21 LAB — BASIC METABOLIC PANEL
Anion gap: 7 (ref 5–15)
BUN: 6 mg/dL (ref 6–20)
CO2: 21 mmol/L — ABNORMAL LOW (ref 22–32)
Calcium: 8.7 mg/dL — ABNORMAL LOW (ref 8.9–10.3)
Chloride: 113 mmol/L — ABNORMAL HIGH (ref 101–111)
Creatinine, Ser: 0.71 mg/dL (ref 0.61–1.24)
Glucose, Bld: 115 mg/dL — ABNORMAL HIGH (ref 65–99)
POTASSIUM: 3.6 mmol/L (ref 3.5–5.1)
SODIUM: 141 mmol/L (ref 135–145)

## 2016-05-21 LAB — HEPATITIS A ANTIBODY, TOTAL: HEP A TOTAL AB: NEGATIVE

## 2016-05-21 LAB — HEPATITIS B SURFACE ANTIGEN: Hepatitis B Surface Ag: NEGATIVE

## 2016-05-21 LAB — RPR: RPR Ser Ql: NONREACTIVE

## 2016-05-21 LAB — HCV COMMENT:

## 2016-05-21 LAB — HEPATITIS B CORE ANTIBODY, TOTAL: Hep B Core Total Ab: NEGATIVE

## 2016-05-21 LAB — HEPATITIS C ANTIBODY (REFLEX)

## 2016-05-21 NOTE — Evaluation (Signed)
Speech Language Pathology Evaluation Patient Details Name: Joseph Walton MRN: [id number] DOB: Feb 28, 1966 Today's Date: 05/21/2016 Time: 4098-1191 SLP Time Calculation (min) (ACUTE ONLY): 31 min  Problem List:  Patient Active Problem List   Diagnosis Date Noted  . AIDS (HCC) 05/21/2016  . Personal history of stroke with current residual effects 05/21/2016  . Candida esophagitis (HCC) 05/19/2016  . HIV disease (HCC) 05/19/2016  . CVA (cerebral infarction) 05/19/2016  . 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 Fungal esophagitis 05/19/2016   Past Medical History:  Past Medical History:  Diagnosis Date  . MI (myocardial infarction) (HCC)   . Stroke [facility])    Past Surgical History:  Past Surgical History:  Procedure Laterality Date  . BRAIN SURGERY    . CARDIAC CATHETERIZATION    . cardiac stents     HPI:  50 yo M admitted 05/19/2016 with consistent intractable hiccups, chest pain, and inability to sleep for 3 days.  Patient was seen in the ED 7/23 for the hiccups and chest pain. During exam he was noted to have oral candidiasis and was treated with Thorazine and discharged.  During this ED visit he was also diagnosed with HIV.  Hiccups have now returned.  Family reports mild memory loss over the last few months.  PMH: MI, CVA, recent HIV diagnosis, brain surgery, cardiac stents   Assessment / Plan / Recommendation Clinical Impression  Joseph Walton was seen at bedside for a cognitive linguistic evaluation with his mother present. He presents with mild/moderate cognitive linguistic deficits characterized by dysarthric speech with reduced vocal intensity, decreased breath support (negatively impacted by hiccups), and poor articulation; decreased attention and memory skills which negatively impact higher level thinking skills (planning and organization). Speech intelligibility is judged to be slightly improved today, however he was seen late in the day yesterday and pt reportedly worse later  in the day. Pt is also being seen for dysphagia (continue D3/mech soft and NTL). Recommend skilled SLP services in next venue (SNF vs rehab) to address cognitive-linguistic deficits and dysphagia to increase independence and decrease burden of care. Pt appears motivated and has excellent family support. Chart review reports history of brain surgery, however both pt and his mother state he has never had brain surgery.    SLP Assessment  Patient needs continued Speech Lanaguage Pathology Services    Follow Up Recommendations  Inpatient Rehab;Skilled Nursing facility    Frequency and Duration min 2x/week  1 week      SLP Evaluation Prior Functioning  Cognitive/Linguistic Baseline: Baseline deficits (deficits became apparent in May 2017) Baseline deficit details: Change in mental status noted in May 2017 Type of Home: House  Lives With: Family (was living alone in [place] prior to April) Available Help at Discharge: Family Education: 4 year degree in psychology at [employer]: Other (comment) (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)   Cognition  Overall Cognitive Status: Impaired/Different from baseline Arousal/Alertness: Awake/alert Orientation Level: Oriented to person;Oriented to place;Oriented to situation;Disoriented to time Attention: Sustained Sustained Attention: Impaired Sustained Attention Impairment: Verbal complex;Functional complex Memory: Impaired Memory Impairment: Storage deficit;Retrieval deficit Awareness: Impaired Awareness Impairment: Emergent impairment Problem Solving: Impaired Problem Solving Impairment: Functional complex;Verbal complex Executive Function: Organizing;Self Monitoring;Decision Making Organizing: Impaired Organizing Impairment: Verbal complex;Functional complex Decision Making: Impaired Decision Making Impairment: Verbal complex;Functional complex Self Monitoring: Impaired Self Monitoring Impairment: Verbal  complex;Functional complex Behaviors: Restless Safety/Judgment: Impaired Comments: emerging    Comprehension  Auditory Comprehension Overall Auditory Comprehension: Appears  within functional limits for tasks assessed Yes/No Questions: Within Functional Limits Commands: Within Functional Limits Conversation: Complex Interfering Components: Attention;Processing speed;Working Radio broadcast assistant: Dietitian: Exceptions to [employer] Reading Comprehension Reading Status: Impaired Sentence Level: Impaired Paragraph Level: Not tested Functional Environmental (signs, name badge): Within functional limits Interfering Components: Attention;Visual acuity Effective Techniques: Large print (pt states he has glasses but not present)    Expression Expression Primary Mode of Expression: Verbal Verbal Expression Overall Verbal Expression: Impaired Initiation: No impairment Level of Generative/Spontaneous Verbalization: Conversation Repetition: No impairment Naming: Impairment Responsive: 76-100% accurate Confrontation: Within functional limits Convergent: Not tested Divergent: 25-49% accurate Pragmatics: Impairment (flat affect) Impairments: Abnormal affect;Monotone Interfering Components: Attention;Speech intelligibility Effective Techniques: Semantic cues Non-Verbal Means of Communication: Not applicable Written Expression Dominant Hand: Left Written Expression: Not tested   Oral / Motor  Oral Motor/Sensory Function Overall Oral Motor/Sensory Function: Mild impairment Facial ROM: Reduced right;Suspected CN VII (facial) dysfunction Facial Symmetry: Abnormal symmetry right;Suspected CN VII (facial) dysfunction Facial Strength: Reduced right;Suspected CN VII (facial) dysfunction Facial Sensation: Within Functional Limits Lingual ROM: Within Functional Limits Lingual Symmetry: Within Functional Limits Lingual  Strength: Reduced Lingual Sensation: Within Functional Limits Velum: Within Functional Limits Mandible: Within Functional Limits Motor Speech Overall Motor Speech: Impaired Respiration: Impaired Level of Impairment: Phrase Phonation: Low vocal intensity Resonance: Within functional limits Articulation: Impaired Level of Impairment: Phrase Intelligibility: Intelligibility reduced Word: 50-74% accurate Phrase: 25-49% accurate Sentence: 25-49% accurate Conversation: 25-49% accurate Motor Planning: Witnin functional limits Motor Speech Errors: Unaware Effective Techniques: Slow rate;Increased vocal intensity;Over-articulate;Pause   GO            Thank you,  Havery Moros, CCC-SLP [phone]          PORTER,DABNEY 05/21/2016, 5:56 PM

## 2016-05-21 NOTE — Care Management Note (Signed)
Case Management Note  Patient Details  Name: Joseph Walton MRN: [id number] Date of Birth: 1966/06/05  Subjective/Objective:    Patient adm from home, lives with parents. Will need SNF due to decline in health recently.                 Action/Plan: CSW aware of need for SNF, family willing to pay out of pocket. CSW making arrangements. Financial Counselor working with patient also.    Expected Discharge Date:  05/22/16               Expected Discharge Plan:  Skilled Nursing Facility  In-House Referral:  Clinical Social Work  Discharge planning Services  CM Consult  Post Acute Care Choice:  NA Choice offered to:  NA  DME Arranged:    DME Agency:     HH Arranged:    HH Agency:     Status of Service:  Completed, signed off  If discussed at [employer], dates discussed:    Additional Comments:  [name], Chrystine Oiler, RN 05/21/2016, 8:13 AM

## 2016-05-21 NOTE — Progress Notes (Signed)
Physical Therapy Treatment Patient Details Name: Joseph Walton MRN: [id number] DOB: 05-26-1966 Today's Date: 05/21/2016    History of Present Illness 50 yo M admitted 05/19/2016 with consistent intractable hiccups, chest pain, and inability to sleep for 3 days.  Patient was seen in the ED 7/23 for the hiccups and chest pain. During exam he was noted to have oral candidiasis and was treated with Thorazine and discharged.  During this ED visit he was also diagnosed with HIV.  Hiccups have now returned.  Family reports mild memory loss over the last few months.  PMH: MI, CVA, recent HIV diagnosis, brain surgery, cardiac stents    PT Comments    Pt received in bed, and was agreeable to PT tx.  Pt continues to have balance deficits leaning to the right due to R sided hemiparesis.  Progress with core strengthening and balance activities in sitting today, and balance activities in standing.  During transfer pt continues to demonstrate R knee buckling.  Continue to recommend SNF for strengthening and balance.    Follow Up Recommendations  SNF     Equipment Recommendations  None recommended by PT    Recommendations for Other Services       Precautions / Restrictions Precautions Precautions: Fall Precaution Comments: Pt reports falling - not able to specify Restrictions Weight Bearing Restrictions: No    Mobility  Bed Mobility Overal bed mobility: Needs Assistance Bed Mobility: Supine to Sit     Supine to sit: Mod assist;HOB elevated     General bed mobility comments: Pt able to pull himself up useing the foot of the bed.  Bed pad used to scoot hips fwd.    Transfers Overall transfer level: Needs assistance Equipment used: Right platform walker Transfers: Sit to/from Stand;Stand Pivot Transfers Sit to Stand: Min assist Stand pivot transfers: Max assist       General transfer comment: Pt with continued leaning to the right, as well as R knee buckling during transfer. Pt  required 1 step commands for stepping to transfer.   Ambulation/Gait Ambulation/Gait assistance:  (NT)               Stairs            Wheelchair Mobility    Modified Rankin (Stroke Patients Only)       Balance   Sitting-balance support: Bilateral upper extremity supported Sitting balance-Leahy Scale: Poor Sitting balance - Comments: Min A for static sitting balance, but improves to supervision when L hand is holding onto the foot of the bed.  Right and left weight shifting with weight bearing through the elbow x 5 reps each side with Min A.   Fwd reaching activities x 5 reps each UE with Min A for R UE.  Postural control: Right lateral lean Standing balance support: Bilateral upper extremity supported Standing balance-Leahy Scale: Fair Standing balance comment: Right and left weight shifting in standing x 10 reps, then attempted marching in place, however pt has great difficulty maintaining R terminal knee extension while picking up the L.                      Cognition Arousal/Alertness: Awake/alert Behavior During Therapy: WFL for tasks assessed/performed Overall Cognitive Status: No family/caregiver present to determine baseline cognitive functioning                      Exercises      General Comments  Pertinent Vitals/Pain Pain Assessment: No/denies pain    Home Living                      Prior Function            PT Goals (current goals can now be found in the care plan section) Acute Rehab PT Goals Patient Stated Goal: Pt wants to get stronger PT Goal Formulation: With patient Time For Goal Achievement: 05/27/16 Potential to Achieve Goals: Good Progress towards PT goals: Progressing toward goals    Frequency  7X/week    PT Plan Current plan remains appropriate    Co-evaluation             End of Session Equipment Utilized During Treatment: Gait belt Activity Tolerance: Patient tolerated treatment  well Patient left: in chair;with call bell/phone within reach     Time: 1429-1455 PT Time Calculation (min) (ACUTE ONLY): 26 min  Charges:  $Therapeutic Activity: 8-22 mins $Neuromuscular Re-education: 8-22 mins                    G Codes:      Beth Karsin Pesta, PT, DPT X: [phone]

## 2016-05-21 NOTE — Progress Notes (Signed)
Speech Language Pathology Treatment: Dysphagia  Patient Details Name: Joseph Walton MRN: [id number] DOB: 08-Mar-1966 Today's Date: 05/21/2016 Time: 7014-1030 SLP Time Calculation (min) (ACUTE ONLY): 14 min  Assessment / Plan / Recommendation Clinical Impression  Pt assessed with thin liquids this date and demonstrated improved performance as compared to yesterday, however he continues to require NTL for safety given negative impact of fatigue. Pt tends to do worse later in the day. Continue with diet as ordered and f/u SLP services at SNF.   HPI HPI: 50 yo M admitted 05/19/2016 with consistent intractable hiccups, chest pain, and inability to sleep for 3 days.  Patient was seen in the ED 7/23 for the hiccups and chest pain. During exam he was noted to have oral candidiasis and was treated with Thorazine and discharged.  During this ED visit he was also diagnosed with HIV.  Hiccups have now returned.  Family reports mild memory loss over the last few months.  PMH: MI, CVA, recent HIV diagnosis, brain surgery, cardiac stents      SLP Plan  Continue with current plan of care     Recommendations  Diet recommendations: Dysphagia 3 (mechanical soft);Nectar-thick liquid Liquids provided via: Cup Medication Administration: Whole meds with liquid Supervision: Patient able to self feed;Full supervision/cueing for compensatory strategies Compensations: Minimize environmental distractions;Slow rate;Small sips/bites Postural Changes and/or Swallow Maneuvers: Seated upright 90 degrees;Upright 30-60 min after meal             Follow up Recommendations: Inpatient Rehab;Skilled Nursing facility Plan: Continue with current plan of care     GO                Piero Mustard 05/21/2016, 6:00 PM

## 2016-05-21 NOTE — Progress Notes (Signed)
Subjective: He says he feels a little better. He was seen by PT and speech yesterday he is still having significant trouble with hiccups  Objective: Vital signs in last 24 hours: Temp:  [98.3 F (36.8 C)-99.1 F (37.3 C)] 99.1 F (37.3 C) (07/27 0558) Pulse Rate:  [83-104] 83 (07/27 0558) Resp:  [20] 20 (07/27 0558) BP: (99-130)/(73-84) 123/73 (07/27 0558) SpO2:  [97 %-100 %] 97 % (07/27 0558) Weight change:  Last BM Date: 05/20/16  Intake/Output from previous day: 07/26 0701 - 07/27 0700 In: 3497.1 [P.O.:720; I.V.:2177.1; IV Piggyback:600] Out: 2700 [Urine:2700]  PHYSICAL EXAM General appearance: His speech is better today. He is somewhat wasted in appearance and has lost about 60 pounds in the last several months. He has facial asymmetry. He has right hemiparesis. Resp: clear to auscultation bilaterally Cardio: regular rate and rhythm, S1, S2 normal, no murmur, click, rub or gallop GI: soft, non-tender; bowel sounds normal; no masses,  no organomegaly Extremities: Right hemiparesis. No edema  Lab Results:  Results for orders placed or performed during the hospital encounter of 05/19/16 (from the past 48 hour(s))  Comprehensive metabolic panel     Status: Abnormal   Collection Time: 05/19/16  5:29 PM  Result Value Ref Range   Sodium 138 135 - 145 mmol/L   Potassium 3.8 3.5 - 5.1 mmol/L   Chloride 107 101 - 111 mmol/L   CO2 25 22 - 32 mmol/L   Glucose, Bld 93 65 - 99 mg/dL   BUN 10 6 - 20 mg/dL   Creatinine, Ser 1.88 0.61 - 1.24 mg/dL   Calcium 8.9 8.9 - 63.6 mg/dL   Total Protein 8.4 (H) 6.5 - 8.1 g/dL   Albumin 3.7 3.5 - 5.0 g/dL   AST 37 15 - 41 U/L   ALT 38 17 - 63 U/L   Alkaline Phosphatase 73 38 - 126 U/L   Total Bilirubin 0.7 0.3 - 1.2 mg/dL   GFR calc non Af Amer >60 >60 mL/min   GFR calc Af Amer >60 >60 mL/min    Comment: (NOTE) The eGFR has been calculated using the CKD EPI equation. This calculation has not been validated in all clinical  situations. eGFR's persistently <60 mL/min signify possible Chronic Kidney Disease.    Anion gap 6 5 - 15  CBC with Differential     Status: Abnormal   Collection Time: 05/19/16  5:29 PM  Result Value Ref Range   WBC 3.1 (L) 4.0 - 10.5 K/uL   RBC 3.97 (L) 4.22 - 5.81 MIL/uL   Hemoglobin 12.4 (L) 13.0 - 17.0 g/dL   HCT 11.0 (L) 38.5 - 11.4 %   MCV 91.9 78.0 - 100.0 fL   MCH 31.2 26.0 - 34.0 pg   MCHC 34.0 30.0 - 36.0 g/dL   RDW 68.5 55.7 - 37.1 %   Platelets 102 (L) 150 - 400 K/uL    Comment: CONSISTENT WITH PREVIOUS RESULT SPECIMEN CHECKED FOR CLOTS    Neutrophils Relative % 65 %   Neutro Abs 2.0 1.7 - 7.7 K/uL   Lymphocytes Relative 25 %   Lymphs Abs 0.8 0.7 - 4.0 K/uL   Monocytes Relative 10 %   Monocytes Absolute 0.3 0.1 - 1.0 K/uL   Eosinophils Relative 0 %   Eosinophils Absolute 0.0 0.0 - 0.7 K/uL   Basophils Relative 0 %   Basophils Absolute 0.0 0.0 - 0.1 K/uL  TSH     Status: Abnormal   Collection Time: 05/19/16  5:30 PM  Result Value Ref Range   TSH 0.349 (L) 0.350 - 4.500 uIU/mL  Vitamin B12     Status: None   Collection Time: 05/19/16  5:30 PM  Result Value Ref Range   Vitamin B-12 223 180 - 914 pg/mL    Comment: (NOTE) This assay is not validated for testing neonatal or myeloproliferative syndrome specimens for Vitamin B12 levels. Performed at [facility]   I-stat troponin, ED     Status: None   Collection Time: 05/19/16  5:41 PM  Result Value Ref Range   Troponin i, poc 0.00 0.00 - 0.08 ng/mL   Comment 3            Comment: Due to the release kinetics of cTnI, a negative result within the first hours of the onset of symptoms does not rule out myocardial infarction with certainty. If myocardial infarction is still suspected, repeat the test at appropriate intervals.   Comprehensive metabolic panel     Status: Abnormal   Collection Time: 05/20/16  5:34 AM  Result Value Ref Range   Sodium 140 135 - 145 mmol/L   Potassium 3.3 (L) 3.5 - 5.1  mmol/L   Chloride 111 101 - 111 mmol/L   CO2 23 22 - 32 mmol/L   Glucose, Bld 104 (H) 65 - 99 mg/dL   BUN 9 6 - 20 mg/dL   Creatinine, Ser 5.03 0.61 - 1.24 mg/dL   Calcium 8.5 (L) 8.9 - 10.3 mg/dL   Total Protein 7.6 6.5 - 8.1 g/dL   Albumin 3.3 (L) 3.5 - 5.0 g/dL   AST 33 15 - 41 U/L   ALT 32 17 - 63 U/L   Alkaline Phosphatase 68 38 - 126 U/L   Total Bilirubin 0.6 0.3 - 1.2 mg/dL   GFR calc non Af Amer >60 >60 mL/min   GFR calc Af Amer >60 >60 mL/min    Comment: (NOTE) The eGFR has been calculated using the CKD EPI equation. This calculation has not been validated in all clinical situations. eGFR's persistently <60 mL/min signify possible Chronic Kidney Disease.    Anion gap 6 5 - 15  CBC     Status: Abnormal   Collection Time: 05/20/16  5:34 AM  Result Value Ref Range   WBC 5.3 4.0 - 10.5 K/uL   RBC 3.34 (L) 4.22 - 5.81 MIL/uL   Hemoglobin 10.5 (L) 13.0 - 17.0 g/dL   HCT 54.6 (L) 56.8 - 12.7 %   MCV 91.9 78.0 - 100.0 fL   MCH 31.4 26.0 - 34.0 pg   MCHC 34.2 30.0 - 36.0 g/dL   RDW 51.7 00.1 - 74.9 %   Platelets 88 (L) 150 - 400 K/uL    Comment: SPECIMEN CHECKED FOR CLOTS CONSISTENT WITH PREVIOUS RESULT   RPR     Status: None   Collection Time: 05/20/16  5:34 AM  Result Value Ref Range   RPR Ser Ql Non Reactive Non Reactive    Comment: (NOTE) Performed At: [facility] [address] [place], [place] [id number] Mila Homer MD [id number]   Hepatitis B surface antigen     Status: None   Collection Time: 05/20/16  9:25 AM  Result Value Ref Range   Hepatitis B Surface Ag Negative Negative    Comment: (NOTE) Performed At: [facility] [address] [place], [place] [id number] Mila Homer MD SP:[id number]   Basic metabolic panel     Status: Abnormal   Collection Time: 05/21/16  5:37  AM  Result Value Ref Range   Sodium 141 135 - 145 mmol/L   Potassium 3.6 3.5 - 5.1 mmol/L   Chloride 113 (H) 101 - 111 mmol/L   CO2 21 (L) 22 - 32  mmol/L   Glucose, Bld 115 (H) 65 - 99 mg/dL   BUN 6 6 - 20 mg/dL   Creatinine, Ser 0.71 0.61 - 1.24 mg/dL   Calcium 8.7 (L) 8.9 - 10.3 mg/dL   GFR calc non Af Amer >60 >60 mL/min   GFR calc Af Amer >60 >60 mL/min    Comment: (NOTE) The eGFR has been calculated using the CKD EPI equation. This calculation has not been validated in all clinical situations. eGFR's persistently <60 mL/min signify possible Chronic Kidney Disease.    Anion gap 7 5 - 15    ABGS No results for input(s): PHART, PO2ART, TCO2, HCO3 in the last 72 hours.  Invalid input(s): PCO2 CULTURES No results found for this or any previous visit (from the past 240 hour(s)). Studies/Results: No results found.  Medications:  Prior to Admission:  Prescriptions Prior to Admission  Medication Sig Dispense Refill Last Dose  . aspirin 81 MG tablet Take 81 mg by mouth every morning.   05/19/2016 at Unknown time  . atorvastatin (LIPITOR) 80 MG tablet Take 40 mg by mouth every morning.  11 05/19/2016 at Unknown time  . chlorproMAZINE (THORAZINE) 10 MG tablet Take 1 tablet (10 mg total) by mouth 3 (three) times daily as needed for hiccoughs. 30 tablet 0 05/19/2016 at Unknown time  . clopidogrel (PLAVIX) 75 MG tablet Take 75 mg by mouth every morning.  11 05/19/2016 at Unknown time  . diphenhydramine-acetaminophen (TYLENOL PM) 25-500 MG TABS tablet Take 2 tablets by mouth at bedtime as needed (for sleep).   Past Month at Unknown time  . fluconazole (DIFLUCAN) 200 MG tablet Take 1 tablet (200 mg total) by mouth 2 (two) times daily. 7 tablet 0 05/19/2016 at Unknown time  . Potassium Chloride ER 20 MEQ TBCR Take 20 mEq by mouth every morning.  0 05/19/2016 at Unknown time   Scheduled: . aspirin  81 mg Oral q morning - 10a  . clopidogrel  75 mg Oral Daily  . dolutegravir  50 mg Oral Daily  . emtricitabine-tenofovir AF  1 tablet Oral Daily  . fluconazole (DIFLUCAN) IV  400 mg Intravenous Q24H  . heparin  5,000 Units Subcutaneous Q8H  .  sulfamethoxazole-trimethoprim  1 tablet Oral Daily   Continuous: . dextrose 5 % and 0.9 % NaCl with KCl 20 mEq/L 125 mL/hr at 05/21/16 0203   EUM:[id number], promethazine  Assesment:He was admitted with Candida esophagitis. He's been treated with Diflucan. He has had a recent stroke and has difficulty with speech and swallowing and has right hemiparesis. It has been recommended that he go to skilled care facility. He is not ready for discharge yet. Principal Problem:   Candida esophagitis (HCC) Active Problems:   HIV disease (HCC)   CVA (cerebral infarction)   HIV dementia ([place])   Fungal esophagitis    Plan: Continue current treatments including IV fluid. I have requested OT evaluation. He may be ready for discharge in the next 24- 48 hours.    LOS: 2 days   Renesmay Nesbitt L 05/21/2016, 8:33 AM

## 2016-05-22 ENCOUNTER — Inpatient Hospital Stay (HOSPITAL_COMMUNITY): Payer: PRIVATE HEALTH INSURANCE

## 2016-05-22 DIAGNOSIS — E876 Hypokalemia: Secondary | ICD-10-CM | POA: Diagnosis present

## 2016-05-22 DIAGNOSIS — E860 Dehydration: Secondary | ICD-10-CM | POA: Diagnosis present

## 2016-05-22 MED ORDER — SODIUM CHLORIDE 0.9 % IV SOLN
INTRAVENOUS | Status: DC
  Administered 2016-05-22: 12:00:00 via INTRAVENOUS

## 2016-05-22 MED ORDER — POTASSIUM CHLORIDE CRYS ER 20 MEQ PO TBCR
20.0000 meq | EXTENDED_RELEASE_TABLET | Freq: Every day | ORAL | Status: DC
  Administered 2016-05-22 – 2016-05-25 (×4): 20 meq via ORAL
  Filled 2016-05-22 (×4): qty 1

## 2016-05-22 NOTE — [place] FL2 (Signed)
[place] MEDICAID FL2 LEVEL OF CARE SCREENING TOOL     IDENTIFICATION  Patient Name: Joseph Walton Birthdate: 1965/12/01 Sex: male Admission Date (Current Location): 05/19/2016  [facility] and [place] Number:  [employer] and Address:  [facility],  [address], [place] [address]      Provider Number: [id number]  Attending Physician Name and Address:  Kari Baars, MD  Relative Name and Phone Number:       Current Level of Care: Hospital Recommended Level of Care: Skilled Nursing Facility Prior Approval Number:    Date Approved/Denied:   PASRR Number: [id number]  Discharge Plan: SNF    Current Diagnoses: Patient Active Problem List   Diagnosis Date Noted  . Dehydration 05/22/2016  . Hypokalemia 05/22/2016  . AIDS (HCC) 05/21/2016  . Personal history of stroke with current residual effects 05/21/2016  . Candida esophagitis (HCC) 05/19/2016  . HIV disease (HCC) 05/19/2016  . CVA (cerebral infarction) 05/19/2016  . 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 Fungal esophagitis 05/19/2016    Orientation RESPIRATION BLADDER Height & Weight     Self, Time, Situation, Place  Normal Continent Weight: 159 lb (72.1 kg) Height:  5\' 10"  (177.8 cm)  BEHAVIORAL SYMPTOMS/MOOD NEUROLOGICAL BOWEL NUTRITION STATUS      Continent Diet (Diet Soft.)  AMBULATORY STATUS COMMUNICATION OF NEEDS Skin   Extensive Assist Verbally Normal                       Personal Care Assistance Level of Assistance  Bathing, Feeding, Dressing Bathing Assistance: Maximum assistance Feeding assistance: Maximum assistance Dressing Assistance: Maximum assistance     Functional Limitations Info  Sight, Hearing, Speech Sight Info: Adequate Hearing Info: Adequate Speech Info: Impaired (Slurred speech)    SPECIAL CARE FACTORS FREQUENCY  OT (By licensed OT), Speech therapy     PT Frequency: 5x/week OT Frequency: 3x/week     Speech Therapy Frequency: 3x/week       Contractures Contractures Info: Not present    Additional Factors Info  Code Status Code Status Info: Full Code             Current Medications (05/22/2016):  This is the current hospital active medication list Current Facility-Administered Medications  Medication Dose Route Frequency Provider Last Rate Last Dose  . 0.9 %  sodium chloride infusion   Intravenous Continuous Kari Baars, MD 50 mL/hr at 05/22/16 1146    . aspirin chewable tablet 81 mg  81 mg Oral q morning - 10a Houston Siren, MD   81 mg at 05/22/16 1139  . chlorproMAZINE (THORAZINE) tablet 10 mg  10 mg Oral TID PRN Houston Siren, MD   10 mg at 05/22/16 1144  . clopidogrel (PLAVIX) tablet 75 mg  75 mg Oral Daily Houston Siren, MD   75 mg at 05/22/16 1139  . dolutegravir (TIVICAY) tablet 50 mg  50 mg Oral Daily Kari Baars, MD   50 mg at 05/22/16 1140  . emtricitabine-tenofovir AF (DESCOVY) 200-25 MG per tablet 1 tablet  1 tablet Oral Daily Kari Baars, MD   1 tablet at 05/22/16 1139  . fluconazole (DIFLUCAN) IVPB 400 mg  400 mg Intravenous Q24H Kari Baars, MD   400 mg at 05/21/16 2300  . heparin injection 5,000 Units  5,000 Units Subcutaneous Q8H Houston Siren, MD   5,000 Units at 05/21/16 2149  . potassium chloride SA (K-DUR,KLOR-CON) CR tablet 20 mEq  20 mEq Oral Daily Kari Baars, MD  20 mEq at 05/22/16 1139  . promethazine (PHENERGAN) tablet 12.5 mg  12.5 mg Oral Q6H PRN Houston Siren, MD   12.5 mg at 05/21/16 1735  . sulfamethoxazole-trimethoprim (BACTRIM DS,SEPTRA DS) 800-160 MG per tablet 1 tablet  1 tablet Oral Daily Kari Baars, MD   1 tablet at 05/22/16 1139     Discharge Medications: Please see discharge summary for a list of discharge medications.  Relevant Imaging Results:  Relevant Lab Results:   Additional Information    Anikah Hogge, [name], LCSW

## 2016-05-22 NOTE — Clinical Social Work Placement (Signed)
   CLINICAL SOCIAL WORK PLACEMENT  NOTE  Date:  05/22/2016  Patient Details  Name: Joseph Walton MRN: [id number] Date of Birth: June 17, 1966  Clinical Social Work is seeking post-discharge placement for this patient at the Skilled  Nursing Facility level of care (*CSW will initial, date and re-position this form in  chart as items are completed):  Yes   Patient/family provided with [place] Clinical Social Work Department's list of facilities offering this level of care within the geographic area requested by the patient (or if unable, by the patient's family).  Yes   Patient/family informed of their freedom to choose among providers that offer the needed level of care, that participate in Medicare, Medicaid or managed care program needed by the patient, have an available bed and are willing to accept the patient.  Yes   Patient/family informed of [place]'s ownership interest in [facility] and [facility], as well as of the fact that they are under no obligation to receive care at these facilities.  PASRR submitted to EDS on 05/20/16     PASRR number received on 05/20/16     Existing PASRR number confirmed on       FL2 transmitted to all facilities in geographic area requested by pt/family on 05/20/16     FL2 transmitted to all facilities within larger geographic area on 05/22/16     Patient informed that his/her managed care company has contracts with or will negotiate with certain facilities, including the following:        Yes   Patient/family informed of bed offers received.  Patient chooses bed at [facility]     Physician recommends and patient chooses bed at      Patient to be transferred to [facility] on  .  Patient to be transferred to facility by       Patient family notified on   of transfer.  Name of family member notified:        PHYSICIAN       Additional Comment:     _______________________________________________ Karn Cassis, LCSW 05/22/2016, 3:24 PM [phone]

## 2016-05-22 NOTE — Clinical Social Work Note (Signed)
Pt accepts bed offer at [facility]. 5 day LOG approved by supervisor and [facility] agreeable. This was explained to pt. He states he will share information with his mom this evening. MD notified and anticipates d/c tomorrow.   Derenda Fennel, LCSW  [phone]

## 2016-05-22 NOTE — Care Management Note (Signed)
Case Management Note  Patient Details  Name: Worthy Kain Lauter MRN: [id number] Date of Birth: 13-Dec-1965   Additional Comments: Patient accepted to [facility] in [place], CSW making arrangements, possibly discharging this weekend.  Muadh Creasy, Chrystine Oiler, RN 05/22/2016, 3:10 PM

## 2016-05-22 NOTE — Evaluation (Signed)
Occupational Therapy Evaluation Patient Details Name: Joseph Walton MRN: [id number] DOB: [birth year] Today's Date: 05/22/2016    History of Present Illness 50 yo M admitted 05/19/2016 with consistent intractable hiccups, chest pain, and inability to sleep for 3 days.  Patient was seen in the ED 7/23 for the hiccups and chest pain. During exam he was noted to have oral candidiasis and was treated with Thorazine and discharged.  During this ED visit he was also diagnosed with HIV.  Hiccups have now returned.  Family reports mild memory loss over the last few months.  PMH: MI, CVA, recent HIV diagnosis, brain surgery, cardiac stents   Clinical Impression   Pt awake, alert, agreeable to OT evaluation/PT treatment. Pt has hx of CVA approximately 2 months ago, no known therapy for this. Pt with significant right side weakness, non-dominant, sensation appears intact, decreased coordination. Pt able to use RUE to reach up to grasp platform walker, reach to the right and grasp paper towels while standing at sink. Grip and pinch strength is limited, shoulder strength 3-/5, elbow 2+/5. Pt requires verbal cuing for problem solving and sequencing tasks, as well as for use of DME. Pt will benefit from SNF at discharge to work on improving strength, balance, functional use of RUE, as well as improving independence and safety during ADL and functional mobility tasks.     Follow Up Recommendations  SNF;Supervision/Assistance - 24 hour    Equipment Recommendations  None recommended by OT       Precautions / Restrictions Precautions Precautions: Fall Precaution Comments: Pt reports falling - not able to specify Restrictions Weight Bearing Restrictions: No      Mobility Bed Mobility Overal bed mobility: Needs Assistance Bed Mobility: Supine to Sit     Supine to sit: Min assist;HOB elevated     General bed mobility comments: Requires verbal cuing for sequencing, verbal cuing for leaning to the  left  Transfers Overall transfer level: Needs assistance Equipment used: Right platform walker Transfers: Sit to/from Stand Sit to Stand: Min assist;Min guard Stand pivot transfers: Mod assist       General transfer comment: Improved ability to place weight through UE's, and diminished R knee buckling today.  Pt requires 1-step commands for sequencing during turn due to mild impulsivity.      Balance Overall balance assessment: Needs assistance   Sitting balance-Leahy Scale: Poor Sitting balance - Comments: pt requires supervision to Min guard for static sitting posture due to lean right.  Pt was taken into the bathroom and was able to utilize the mirror to assist with feedback for correction of posture. Postural control: Right lateral lean Standing balance support: Bilateral upper extremity supported Standing balance-Leahy Scale: Fair Standing balance comment: Mirror in bathroom utilized for feedback for standing posture.  Pt able to obtain and maintain midline with mirror feedback, and was able to stand at the sink to wash his hands with Min guard.                             ADL Overall ADL's : Needs assistance/impaired Eating/Feeding: Set up;Bed level   Grooming: Wash/dry hands;Min guard;Standing Grooming Details (indicate cue type and reason): Pt able to reach for and grasp paper towels with RUE             Lower Body Dressing: Moderate assistance;Maximal assistance;Sitting/lateral leans               Functional mobility during  ADLs: Minimal assistance;Moderate assistance;+2 for physical assistance General ADL Comments: Pt demonstrates ability to use RUE as gross assist during ADL tasks.     Vision Vision Assessment?: No apparent visual deficits          Pertinent Vitals/Pain Pain Assessment: No/denies pain     Hand Dominance Left   Extremity/Trunk Assessment Upper Extremity Assessment Upper Extremity Assessment: RUE deficits/detail RUE  Deficits / Details: shoulder flexion: 3-/5, elbow flexion: 2+/5, limited grip strength.   RUE Coordination: decreased fine motor;decreased gross motor   Lower Extremity Assessment Lower Extremity Assessment: Defer to PT evaluation       Communication Communication Communication: Expressive difficulties   Cognition Arousal/Alertness: Awake/alert Behavior During Therapy: WFL for tasks assessed/performed Overall Cognitive Status: Impaired/Different from baseline                                Home Living Family/patient expects to be discharged to:: Skilled nursing facility Living Arrangements: Parent (mom and dad) Available Help at Discharge: Family                         Home Equipment: Dan Humphreys - 2 wheels      Lives With: Family (parents PTA)    Prior Functioning/Environment Level of Independence: Needs assistance  Gait / Transfers Assistance Needed: Pt states that someone has to be with hime when he is up and walking - usually his dad. Pt states he uses a walker.  ADL's / Homemaking Assistance Needed: Pt states that his dad helps him dress and bathe Communication / Swallowing Assistance Needed: Pt is very difficult to understand at times - minimal dentation, as well as dysarthria.       OT Diagnosis: Hemiplegia non-dominant side;Generalized weakness;Cognitive deficits   OT Problem List: Decreased strength;Decreased activity tolerance;Impaired balance (sitting and/or standing);Decreased coordination;Decreased cognition;Decreased safety awareness;Decreased knowledge of use of DME or AE;Impaired UE functional use   OT Treatment/Interventions: Self-care/ADL training;Therapeutic exercise;Neuromuscular education;DME and/or AE instruction;Therapeutic activities;Visual/perceptual remediation/compensation;Patient/family education    OT Goals(Current goals can be found in the care plan section) Acute Rehab OT Goals Patient Stated Goal: To get stronger OT Goal  Formulation: With patient Time For Goal Achievement: 06/05/16 Potential to Achieve Goals: Good  OT Frequency: Min 2X/week   Barriers to D/C: Decreased caregiver support  Pt family unwilling to care for pt on discharge       Co-evaluation PT/OT/SLP Co-Evaluation/Treatment: Yes Reason for Co-Treatment: For patient/therapist safety PT goals addressed during session: Mobility/safety with mobility;Balance;Proper use of DME OT goals addressed during session: ADL's and self-care;Proper use of Adaptive equipment and DME      End of Session Equipment Utilized During Treatment: Gait belt;Rolling walker (platform walker)  Activity Tolerance: Patient tolerated treatment well Patient left: in chair;with call bell/phone within reach   Time: 0857-0926 OT Time Calculation (min): 29 min Charges:  OT General Charges $OT Visit: 1 Procedure OT Evaluation $OT Eval Moderate Complexity: 1 Procedure  Ezra Sites, OTR/L  [phone]  05/22/2016, 11:28 AM

## 2016-05-22 NOTE — Progress Notes (Signed)
Subjective: He indicates that he feels a little better today. His speech is better understood. He says he can swallow a little better. He is still very weak on the right side. Hiccups have resolved  Objective: Vital signs in last 24 hours: Temp:  [97.9 F (36.6 C)-99.4 F (37.4 C)] 99.4 F (37.4 C) (07/28 0639) Pulse Rate:  [79-98] 79 (07/28 0639) Resp:  [15-20] 20 (07/28 0639) BP: (107-140)/(70-98) 140/98 (07/28 0639) SpO2:  [97 %-100 %] 100 % (07/28 0639) Weight change:  Last BM Date: 05/20/16  Intake/Output from previous day: 07/27 0701 - 07/28 0700 In: 3600 [P.O.:600; I.V.:3000] Out: 4350 [Urine:4350]  PHYSICAL EXAM General appearance: alert and mild distress Resp: clear to auscultation bilaterally Cardio: regular rate and rhythm, S1, S2 normal, no murmur, click, rub or gallop GI: soft, non-tender; bowel sounds normal; no masses,  no organomegaly ExtremitieHis right hemiparesis is unchangedHis right hemiparesis is unchanged  Lab Results:  Results for orders placed or performed during the hospital encounter of 05/19/16 (from the past 48 hour(s))  Hepatitis A antibody, total     Status: None   Collection Time: 05/20/16  9:25 AM  Result Value Ref Range   Hep A Total Ab Negative Negative    Comment: (NOTE) Performed At: [facility] [address] [place], [place] [id number] Mila Homer MD [id number]   Hepatitis B core antibody, total     Status: None   Collection Time: 05/20/16  9:25 AM  Result Value Ref Range   Hep B Core Total Ab Negative Negative    Comment: (NOTE) Performed At: [facility] [address] [place], [place] [id number] Mila Homer MD [id number]   Hepatitis B surface antigen     Status: None   Collection Time: 05/20/16  9:25 AM  Result Value Ref Range   Hepatitis B Surface Ag Negative Negative    Comment: (NOTE) Performed At: [facility] [address] [place], [place] [id number] Mila Homer MD  [id number]   Hepatitis c antibody (reflex)     Status: None   Collection Time: 05/20/16  9:25 AM  Result Value Ref Range   HCV Ab <0.1 0.0 - 0.9 s/co ratio    Comment: (NOTE) Performed At: [facility] [address] [place], [place] [id number] Mila Homer MD [id number]   HCV Comment:     Status: None   Collection Time: 05/20/16  9:25 AM  Result Value Ref Range   Comment: Comment     Comment: (NOTE) Non reactive HCV antibody screen is consistent with no HCV infection, unless recent infection is suspected or other evidence exists to indicate HCV infection. Performed At: [facility] [address] [place], [place] [id number] Mila Homer MD [id number]   Basic metabolic panel     Status: Abnormal   Collection Time: 05/21/16  5:37 AM  Result Value Ref Range   Sodium 141 135 - 145 mmol/L   Potassium 3.6 3.5 - 5.1 mmol/L   Chloride 113 (H) 101 - 111 mmol/L   CO2 21 (L) 22 - 32 mmol/L   Glucose, Bld 115 (H) 65 - 99 mg/dL   BUN 6 6 - 20 mg/dL   Creatinine, Ser 6.13 0.61 - 1.24 mg/dL   Calcium 8.7 (L) 8.9 - 10.3 mg/dL   GFR calc non Af Amer >60 >60 mL/min   GFR calc Af Amer >60 >60 mL/min    Comment: (NOTE) The eGFR has been calculated using the CKD EPI equation. This  calculation has not been validated in all clinical situations. eGFR's persistently <60 mL/min signify possible Chronic Kidney Disease.    Anion gap 7 5 - 15    ABGS No results for input(s): PHART, PO2ART, TCO2, HCO3 in the last 72 hours.  Invalid input(s): PCO2 CULTURES No results found for this or any previous visit (from the past 240 hour(s)). Studies/Results: No results found.  Medications:  Prior to Admission:  Prescriptions Prior to Admission  Medication Sig Dispense Refill Last Dose  . aspirin 81 MG tablet Take 81 mg by mouth every morning.   05/19/2016 at Unknown time  . atorvastatin (LIPITOR) 80 MG tablet Take 40 mg by mouth every morning.  11 05/19/2016 at  Unknown time  . chlorproMAZINE (THORAZINE) 10 MG tablet Take 1 tablet (10 mg total) by mouth 3 (three) times daily as needed for hiccoughs. 30 tablet 0 05/19/2016 at Unknown time  . clopidogrel (PLAVIX) 75 MG tablet Take 75 mg by mouth every morning.  11 05/19/2016 at Unknown time  . diphenhydramine-acetaminophen (TYLENOL PM) 25-500 MG TABS tablet Take 2 tablets by mouth at bedtime as needed (for sleep).   Past Month at Unknown time  . fluconazole (DIFLUCAN) 200 MG tablet Take 1 tablet (200 mg total) by mouth 2 (two) times daily. 7 tablet 0 05/19/2016 at Unknown time  . Potassium Chloride ER 20 MEQ TBCR Take 20 mEq by mouth every morning.  0 05/19/2016 at Unknown time   Scheduled: . aspirin  81 mg Oral q morning - 10a  . clopidogrel  75 mg Oral Daily  . dolutegravir  50 mg Oral Daily  . emtricitabine-tenofovir AF  1 tablet Oral Daily  . fluconazole (DIFLUCAN) IV  400 mg Intravenous Q24H  . heparin  5,000 Units Subcutaneous Q8H  . sulfamethoxazole-trimethoprim  1 tablet Oral Daily   Continuous: . dextrose 5 % and 0.9 % NaCl with KCl 20 mEq/L 125 mL/hr at 05/21/16 0203   OMB:[id number], promethazine  Assesment:He was admitted with multiple medical problems. He had Candida esophagitis and because of difficulty with swallowing was dehydrated and had low potassium. He had recent discovery that he was HIV positive and his Candida esophagitis is an AIDS defining illness. He also has what appears to be some HIV dementia although it's hard to tell how much of this may be related to his recent stroke. He has had what I believe is age-related wasting. He is improving as far as his esophagitis is concerned. His potassium is better. He looks better as far as his hydration  He had a recent stroke which has left him with slurred speech and a right hemiparesis as well as some swallowing difficulty. He was being fully evaluated for that when he developed the esophagitis and he has not had carotid Doppler done  so I'm going to order that Principal Problem:   Candida esophagitis ([place]) Active Problems:   HIV disease ([place])   HIV dementia ([place])   Fungal esophagitis   AIDS ([place])   Personal history of stroke with current residual effects    Plan: Continue treatments. Plan is for him to go to skilled care facility and I think that can probably be done tomorrow    LOS: 3 days   Brei Pociask L 05/22/2016, 8:10 AM

## 2016-05-22 NOTE — Progress Notes (Signed)
Physical Therapy Treatment Patient Details Name: Joseph Walton [place]on MRN: [id number] DOB: [birth year] Today's Date: 05/22/2016    History of Present Illness 50 yo M admitted 05/19/2016 with consistent intractable hiccups, chest pain, and inability to sleep for 3 days.  Patient was seen in the ED 7/23 for the hiccups and chest pain. During exam he was noted to have oral candidiasis and was treated with Thorazine and discharged.  During this ED visit he was also diagnosed with HIV.  Hiccups have now returned.  Family reports mild memory loss over the last few months.  PMH: MI, CVA, recent HIV diagnosis, brain surgery, cardiac stents    PT Comments    Pt received in bed, and was agreeable to PT tx, and OT evaluation.  Pt demonstrated improvement with R knee control during transfers and therefore was able to initiate gait training today, and ambulated 81ft with R platform RW with min/Mod A.  Pt continues to demonstrate improvement with balance and posture with feedback of mirror.  Pt continues to demonstrate need for SNF to improve strength and balance.    Follow Up Recommendations  SNF     Equipment Recommendations  None recommended by PT    Recommendations for Other Services       Precautions / Restrictions Precautions Precautions: Fall Precaution Comments: Pt reports falling - not able to specify Restrictions Weight Bearing Restrictions: No    Mobility  Bed Mobility Overal bed mobility: Needs Assistance Bed Mobility: Supine to Sit     Supine to sit: Min assist;HOB elevated     General bed mobility comments: Continues to require vc's for sequencing task, and to lean to the left once sitting on the EOB.   Transfers Overall transfer level: Needs assistance Equipment used: Right platform walker Transfers: Sit to/from Stand;Stand Pivot Transfers Sit to Stand: Min assist;Min guard Stand pivot transfers: Mod assist       General transfer comment: Improved ability to place  weight through UE's, and diminished R knee buckling today.  Pt requires 1-step commands for sequencing during turn due to mild impulsivity.    Ambulation/Gait Ambulation/Gait assistance: Min assist;Mod assist;+2 safety/equipment Ambulation Distance (Feet): 10 Feet Assistive device: Right platform walker Gait Pattern/deviations: Step-to pattern;Decreased step length - left;Trunk flexed;Decreased weight shift to left;Steppage   Gait velocity interpretation: <1.8 ft/sec, indicative of risk for recurrent falls General Gait Details: Pt demonstrates hyper extension due to poor quad control with terminal knee extension. Pt also demonstrates increased lean to the right due to fatigue.  Pt requries 1 step commands for sequencing of gait at times.  Pt requires assistance for forward progression of R platform RW.   Stairs            Wheelchair Mobility    Modified Rankin (Stroke Patients Only)       Balance Overall balance assessment: Needs assistance   Sitting balance-Leahy Scale: Poor Sitting balance - Comments: pt requires supervision to Min guard for static sitting posture due to lean right.  Pt was taken into the bathroom and was able to utilize the mirror to assist with feedback for correction of posture. Postural control: Right lateral lean Standing balance support: Bilateral upper extremity supported Standing balance-Leahy Scale: Fair Standing balance comment: Mirror in bathroom utilized for feedback for standing posture.  Pt able to obtain and maintain midline with mirror feedback, and was able to stand at the sink to wash his hands with Min guard.  Cognition Arousal/Alertness: Awake/alert Behavior During Therapy: WFL for tasks assessed/performed Overall Cognitive Status: Impaired/Different from baseline                      Exercises      General Comments        Pertinent Vitals/Pain Pain Assessment: No/denies pain    Home Living                       Prior Function            PT Goals (current goals can now be found in the care plan section) Acute Rehab PT Goals Patient Stated Goal: Pt wants to get stronger PT Goal Formulation: With patient Time For Goal Achievement: 05/27/16 Potential to Achieve Goals: Good Progress towards PT goals: Progressing toward goals    Frequency  7X/week    PT Plan Current plan remains appropriate    Co-evaluation PT/OT/SLP Co-Evaluation/Treatment: Yes Reason for Co-Treatment: For patient/therapist safety PT goals addressed during session: Mobility/safety with mobility;Balance;Proper use of DME OT goals addressed during session: ADL's and self-care;Proper use of Adaptive equipment and DME     End of Session Equipment Utilized During Treatment: Gait belt Activity Tolerance: Patient tolerated treatment well Patient left: in chair;with call bell/phone within reach     Time: 0900-0928 PT Time Calculation (min) (ACUTE ONLY): 28 min  Charges:  $Gait Training: 8-22 mins $Therapeutic Activity: 8-22 mins                    G Codes:      Beth Sherolyn Trettin, PT, DPT X: [phone]

## 2016-05-22 NOTE — Clinical Social Work Note (Signed)
CSW spoke with Tami at [facility] who advised that the facility would have to recind their offer due to [employer] being unwilling to "carve out medications" for patient. She advised that patient's medications were expensive and the facility desired for the insurance company to assist in absorbing some of the cost.   CSW spoke with patient and advised that the bed offer had bee recinded form [facility]. Patient advised that CSW could send his clinicals to [facility] and [facility].  Cherine Drumgoole, [name], LCSW

## 2016-05-23 LAB — BASIC METABOLIC PANEL
Anion gap: 5 (ref 5–15)
BUN: 6 mg/dL (ref 6–20)
CHLORIDE: 115 mmol/L — AB (ref 101–111)
CO2: 22 mmol/L (ref 22–32)
CREATININE: 0.77 mg/dL (ref 0.61–1.24)
Calcium: 8.8 mg/dL — ABNORMAL LOW (ref 8.9–10.3)
GFR calc Af Amer: >60 mL/min (ref >60)
GFR calc non Af Amer: >60 mL/min (ref >60)
GLUCOSE: 83 mg/dL (ref 65–99)
POTASSIUM: 3.7 mmol/L (ref 3.5–5.1)
Sodium: 142 mmol/L (ref 135–145)

## 2016-05-23 MED ORDER — EMTRICITABINE-TENOFOVIR AF 200-25 MG PO TABS: 1.0000 | ORAL_TABLET | Freq: Every day | ORAL | Status: DC

## 2016-05-23 MED ORDER — FLUCONAZOLE 100 MG PO TABS
400.0000 mg | ORAL_TABLET | Freq: Every day | ORAL | Status: DC
  Administered 2016-05-23 – 2016-05-25 (×3): 400 mg via ORAL
  Filled 2016-05-23 (×3): qty 4

## 2016-05-23 MED ORDER — DOLUTEGRAVIR SODIUM 50 MG PO TABS: 50.0000 mg | ORAL_TABLET | Freq: Every day | ORAL | Status: DC

## 2016-05-23 MED ORDER — SULFAMETHOXAZOLE-TRIMETHOPRIM 800-160 MG PO TABS: 1.0000 | ORAL_TABLET | Freq: Every day | ORAL | Status: DC

## 2016-05-23 MED ORDER — FLUCONAZOLE 200 MG PO TABS: 400.0000 mg | ORAL_TABLET | Freq: Every day | ORAL | 0 refills | Status: DC

## 2016-05-23 MED ORDER — PROMETHAZINE HCL 12.5 MG PO TABS: 12.5000 mg | ORAL_TABLET | Freq: Four times a day (QID) | ORAL | 0 refills | Status: DC | PRN

## 2016-05-23 NOTE — Clinical Social Work Note (Signed)
CSW received call from [facility] liaison Erich Montane. Thayer Ohm states that the facility will not be able to accept the patient. He states that the facility found out late yesterday that they are not in contract with [employer] and will therefore not be able to accept the patient. CSW will have to find another option for the patient.  Roddie Mc MSW, [place], [place], [phone]

## 2016-05-23 NOTE — Progress Notes (Signed)
Physical Therapy Treatment Patient Details Name: Joseph Walton MRN: [id number] DOB: June 26, 1966 Today's Date: 05/23/2016    History of Present Illness 50 yo M admitted 05/19/2016 with consistent intractable hiccups, chest pain, and inability to sleep for 3 days.  Patient was seen in the ED 7/23 for the hiccups and chest pain. During exam he was noted to have oral candidiasis and was treated with Thorazine and discharged.  During this ED visit he was also diagnosed with HIV.  Hiccups have now returned.  Family reports mild memory loss over the last few months.  PMH: MI, CVA, recent HIV diagnosis, brain surgery, cardiac stents    PT Comments    Pt was able to increase gait distance to 15 ft today, however continues to require 1 -step cues for sequencing.  Pt also able to complete LE exercises with good technique.  Continue to recommend SNF.   Follow Up Recommendations  SNF     Equipment Recommendations  None recommended by PT    Recommendations for Other Services       Precautions / Restrictions Precautions Precautions: Fall Precaution Comments: Pt reports falling - not able to specify Restrictions Weight Bearing Restrictions: No    Mobility  Bed Mobility Overal bed mobility: Needs Assistance Bed Mobility: Supine to Sit     Supine to sit: Min guard        Transfers Overall transfer level: Needs assistance Equipment used: Right platform walker Transfers: Sit to/from Stand Sit to Stand: Min assist;Min guard Stand pivot transfers: Min assist       General transfer comment: 1 step cues for turning, and had pt go slow.   Ambulation/Gait Ambulation/Gait assistance: Min assist;Mod assist Ambulation Distance (Feet): 15 Feet Assistive device: Right platform walker Gait Pattern/deviations: Step-to pattern;Decreased weight shift to left;Decreased weight shift to right;Trunk flexed;Steppage     General Gait Details: Continues to need 1 step cues for sequencing.     Stairs            Wheelchair Mobility    Modified Rankin (Stroke Patients Only)       Balance Overall balance assessment: Needs assistance Sitting-balance support: Bilateral upper extremity supported Sitting balance-Leahy Scale: Fair     Standing balance support: Bilateral upper extremity supported Standing balance-Leahy Scale: Fair                      Cognition Arousal/Alertness: Awake/alert Behavior During Therapy: WFL for tasks assessed/performed Overall Cognitive Status: Within Functional Limits for tasks assessed                      Exercises General Exercises - Lower Extremity Ankle Circles/Pumps: Strengthening;Both;Seated;Other (comment) (for 1 min) Long Arc Quad: Strengthening;Right;10 reps;Seated;Other (comment) (with 3 second hold) Hip Flexion/Marching: Strengthening;Both;10 reps;Seated    General Comments        Pertinent Vitals/Pain Pain Assessment: No/denies pain    Home Living                      Prior Function            PT Goals (current goals can now be found in the care plan section) Acute Rehab PT Goals Patient Stated Goal: To get stronger PT Goal Formulation: With patient Time For Goal Achievement: 05/27/16 Potential to Achieve Goals: Good Progress towards PT goals: Progressing toward goals    Frequency       PT Plan Current plan remains appropriate  Co-evaluation             End of Session Equipment Utilized During Treatment: Gait belt Activity Tolerance: Patient tolerated treatment well Patient left: in chair;with call bell/phone within reach     Time:  -     Charges:                       G Codes:      Dakhari Zuver June 06, 2016, 11:37 AM

## 2016-05-23 NOTE — Discharge Summary (Signed)
Physician Discharge Summary  Patient ID: Joseph Walton MRN: [id number] DOB/AGE: November 08, 1965 50 y.o. Primary Care Physician:Breckyn Ticas L, MD Admit date: 05/19/2016 Discharge date: 05/23/2016    Discharge Diagnoses:   Principal Problem:   Candida esophagitis (HCC) Active Problems:   HIV disease (HCC)   HIV dementia (HCC)   Fungal esophagitis   AIDS (HCC)   Personal history of stroke with current residual effects   Dehydration   Hypokalemia     Medication List    TAKE these medications   aspirin 81 MG tablet Take 81 mg by mouth every morning.   atorvastatin 80 MG tablet Commonly known as:  LIPITOR Take 40 mg by mouth every morning.   chlorproMAZINE 10 MG tablet Commonly known as:  THORAZINE Take 1 tablet (10 mg total) by mouth 3 (three) times daily as needed for hiccoughs.   clopidogrel 75 MG tablet Commonly known as:  PLAVIX Take 75 mg by mouth every morning.   diphenhydramine-acetaminophen 25-500 MG Tabs tablet Commonly known as:  TYLENOL PM Take 2 tablets by mouth at bedtime as needed (for sleep).   dolutegravir 50 MG tablet Commonly known as:  TIVICAY Take 1 tablet (50 mg total) by mouth daily.   emtricitabine-tenofovir AF 200-25 MG tablet Commonly known as:  DESCOVY Take 1 tablet by mouth daily.   fluconazole 200 MG tablet Commonly known as:  DIFLUCAN Take 1 tablet (200 mg total) by mouth 2 (two) times daily.   Potassium Chloride ER 20 MEQ Tbcr Take 20 mEq by mouth every morning.   promethazine 12.5 MG tablet Commonly known as:  PHENERGAN Take 1 tablet (12.5 mg total) by mouth every 6 (six) hours as needed for nausea (Or for hiccups.).   sulfamethoxazole-trimethoprim 800-160 MG tablet Commonly known as:  BACTRIM DS,SEPTRA DS Take 1 tablet by mouth daily.       Discharged Condition:Improved    Consults: Telephone with infectious disease  Significant Diagnostic Studies: Dg Chest 2 View  Result Date: 05/17/2016 CLINICAL DATA:   Hiccups for over 12 hours. Status post fall this morning. EXAM: CHEST  2 VIEW COMPARISON:  PA and lateral chest 08/30/2009. FINDINGS: Lungs are clear. Heart size is normal. No pneumothorax or pleural effusion. No bony abnormality. IMPRESSION: Negative chest. Electronically Signed   By: Drusilla Kanner M.D.   On: 05/17/2016 17:23  Ct Head Wo Contrast  Result Date: 05/12/2016 CLINICAL DATA:  Development of slurred speech over the last several months. Possible stroke. EXAM: CT HEAD WITHOUT CONTRAST TECHNIQUE: Contiguous axial images were obtained from the base of the skull through the vertex without intravenous contrast. COMPARISON:  None. FINDINGS: There is a late subacute to old cortical and subcortical infarction in the right posterior frontal cortical and subcortical brain. There is probably an infarction in the right lateral thalamus/ posterior limb internal capsule which could be old or subacute. No sign of mass lesion, hemorrhage, hydrocephalus or extra-axial collection. The calvarium is unremarkable. Sinuses, middle ears and mastoids are clear. IMPRESSION: Late subacute or old cortical and subcortical infarction in the right posterior frontal lobe. Probable similar age infarction at the right lateral thalamus/posterior limb internal capsule. No sign of hemorrhage or mass effect. Electronically Signed   By: Paulina Fusi M.D.   On: 05/12/2016 07:24   US Carotid Bilateral  Result Date: 05/22/2016 CLINICAL DATA:  Stroke, vertigo and history of coronary artery disease. EXAM: BILATERAL CAROTID DUPLEX ULTRASOUND TECHNIQUE: Wallace Cullens scale imaging, color Doppler and duplex ultrasound were performed of bilateral carotid and vertebral  arteries in the neck. COMPARISON:  None. FINDINGS: Criteria: Quantification of carotid stenosis is based on velocity parameters that correlate the residual internal carotid diameter with NASCET-based stenosis levels, using the diameter of the distal internal carotid lumen as the  denominator for stenosis measurement. The following velocity measurements were obtained: RIGHT ICA:  88/32 cm/sec CCA:  88/20 cm/sec SYSTOLIC ICA/CCA RATIO:  1.0 DIASTOLIC ICA/CCA RATIO:  1.8 ECA:  94 cm/sec LEFT ICA:  76/26 cm/sec CCA:  90/20 cm/sec SYSTOLIC ICA/CCA RATIO:  0.8 DIASTOLIC ICA/CCA RATIO:  1.3 ECA:  85 cm/sec RIGHT CAROTID ARTERY: There is a mild amount of partially calcified plaque at the level of the distal common carotid artery and carotid bulb extending into the proximal ICA. Velocities and waveforms are normal and estimated right ICA stenosis is less than 50%. RIGHT VERTEBRAL ARTERY: Antegrade flow with normal waveform and velocity. LEFT CAROTID ARTERY: There is a minimal amount of noncalcified plaque at the level of the distal carotid bulb and proximal ICA. Velocities and waveforms are unremarkable and estimated left ICA stenosis is less than 50%. LEFT VERTEBRAL ARTERY: Antegrade flow with normal waveform and velocity. IMPRESSION: Mild amount of plaque at the level of both carotid bulbs and proximal internal carotid arteries, right greater than left. No significant carotid stenosis identified with estimated bilateral ICA stenoses of less than 50%. Electronically Signed   By: Irish Lack M.D.   On: 05/22/2016 11:45   Lab Results: Basic Metabolic Panel:  Recent Labs  63/78/58 0537 05/23/16 0619  NA 141 142  K 3.6 3.7  CL 113* 115*  CO2 21* 22  GLUCOSE 115* 83  BUN 6 6  CREATININE 0.71 0.77  CALCIUM 8.7* 8.8*   Liver Function Tests: No results for input(s): AST, ALT, ALKPHOS, BILITOT, PROT, ALBUMIN in the last 72 hours.   CBC: No results for input(s): WBC, NEUTROABS, HGB, HCT, MCV, PLT in the last 72 hours.  No results found for this or any previous visit (from the past 240 hour(s)).   Hospital Course: This is a 50 year old who had a stroke about a month ago. He's had increasing problems with swallowing and went to the emergency department where he was discovered to  have Candida esophagitis and was HIV positive. This is an AIDS defining illness. A number of his markers are still pending. He became increasingly dehydrated not able to eat and had more trouble with ambulating. Once he was admitted he was started on intravenous Diflucan and slowly improved. He had intractable hiccups which are better and sometimes he is now going several hours without any more hiccups. Physical therapy consultation recommended skilled care facility. Speech made the same recommendation. His swallowing is better.  Discharge Exam: Blood pressure (!) 131/98, pulse 76, temperature 97.9 F (36.6 C), temperature source Oral, resp. rate 20, height 5\' 10"  (1.778 m), weight 72.1 kg (159 lb), SpO2 100 %. He is awake and alert. He has right hemiparesis. His speech is better. He does not have evidence of oral thrush now.  Disposition: To skilled care facility. He will be on dysphagia 3 diet with nectar thick fluids. He will require Diflucan 400 mg daily for another 15 days. He will need PT OT and speech. He will need follow-up in the infectious disease office. Basic metabolic profile in 3 days     Signed: Vikash Nest L   05/23/2016, 10:20 AM

## 2016-05-23 NOTE — Progress Notes (Signed)
Subjective: He says he feels better. He has no new complaints. He is scheduled to potentially go to skilled care facility but there have been problems with getting a bed assignment. He is having some hiccups again  Objective: Vital signs in last 24 hours: Temp:  [97.8 F (36.6 C)-97.9 F (36.6 C)] 97.9 F (36.6 C) (07/29 0546) Pulse Rate:  [74-81] 76 (07/29 0546) Resp:  [20] 20 (07/29 0546) BP: (101-131)/(54-98) 131/98 (07/29 0546) SpO2:  [100 %] 100 % (07/29 0546) Weight change:  Last BM Date: 05/20/16  Intake/Output from previous day: 07/28 0701 - 07/29 0700 In: 640 [P.O.:240; I.V.:400] Out: 2600 [Urine:2600]  PHYSICAL EXAM General appearance: alert, cooperative and no distress Resp: clear to auscultation bilaterally Cardio: regular rate and rhythm, S1, S2 normal, no murmur, click, rub or gallop GI: soft, non-tender; bowel sounds normal; no masses,  no organomegaly Extremities: extremities normal, atraumatic, no cyanosis or edema Central nervous system shows he still has facial asymmetry right hemiparesis and he is still having some hiccups although they are much improved  Lab Results:  Results for orders placed or performed during the hospital encounter of 05/19/16 (from the past 48 hour(s))  Basic metabolic panel     Status: Abnormal   Collection Time: 05/23/16  6:19 AM  Result Value Ref Range   Sodium 142 135 - 145 mmol/L   Potassium 3.7 3.5 - 5.1 mmol/L   Chloride 115 (H) 101 - 111 mmol/L   CO2 22 22 - 32 mmol/L   Glucose, Bld 83 65 - 99 mg/dL   BUN 6 6 - 20 mg/dL   Creatinine, Ser 0.77 0.61 - 1.24 mg/dL   Calcium 8.8 (L) 8.9 - 10.3 mg/dL   GFR calc non Af Amer >60 >60 mL/min   GFR calc Af Amer >60 >60 mL/min    Comment: (NOTE) The eGFR has been calculated using the CKD EPI equation. This calculation has not been validated in all clinical situations. eGFR's persistently <60 mL/min signify possible Chronic Kidney Disease.    Anion gap 5 5 - 15    ABGS No  results for input(s): PHART, PO2ART, TCO2, HCO3 in the last 72 hours.  Invalid input(s): PCO2 CULTURES No results found for this or any previous visit (from the past 240 hour(s)). Studies/Results: US Carotid Bilateral  Result Date: 05/22/2016 CLINICAL DATA:  Stroke, vertigo and history of coronary artery disease. EXAM: BILATERAL CAROTID DUPLEX ULTRASOUND TECHNIQUE: Pearline Cables scale imaging, color Doppler and duplex ultrasound were performed of bilateral carotid and vertebral arteries in the neck. COMPARISON:  None. FINDINGS: Criteria: Quantification of carotid stenosis is based on velocity parameters that correlate the residual internal carotid diameter with NASCET-based stenosis levels, using the diameter of the distal internal carotid lumen as the denominator for stenosis measurement. The following velocity measurements were obtained: RIGHT ICA:  88/32 cm/sec CCA:  32/95 cm/sec SYSTOLIC ICA/CCA RATIO:  1.0 DIASTOLIC ICA/CCA RATIO:  1.8 ECA:  94 cm/sec LEFT ICA:  76/26 cm/sec CCA:  18/84 cm/sec SYSTOLIC ICA/CCA RATIO:  0.8 DIASTOLIC ICA/CCA RATIO:  1.3 ECA:  85 cm/sec RIGHT CAROTID ARTERY: There is a mild amount of partially calcified plaque at the level of the distal common carotid artery and carotid bulb extending into the proximal ICA. Velocities and waveforms are normal and estimated right ICA stenosis is less than 50%. RIGHT VERTEBRAL ARTERY: Antegrade flow with normal waveform and velocity. LEFT CAROTID ARTERY: There is a minimal amount of noncalcified plaque at the level of the distal carotid bulb  and proximal ICA. Velocities and waveforms are unremarkable and estimated left ICA stenosis is less than 50%. LEFT VERTEBRAL ARTERY: Antegrade flow with normal waveform and velocity. IMPRESSION: Mild amount of plaque at the level of both carotid bulbs and proximal internal carotid arteries, right greater than left. No significant carotid stenosis identified with estimated bilateral ICA stenoses of less than 50%.  Electronically Signed   By: Aletta Edouard M.D.   On: 05/22/2016 11:45   Medications:  Prior to Admission:  Prescriptions Prior to Admission  Medication Sig Dispense Refill Last Dose  . aspirin 81 MG tablet Take 81 mg by mouth every morning.   05/19/2016 at Unknown time  . atorvastatin (LIPITOR) 80 MG tablet Take 40 mg by mouth every morning.  11 05/19/2016 at Unknown time  . chlorproMAZINE (THORAZINE) 10 MG tablet Take 1 tablet (10 mg total) by mouth 3 (three) times daily as needed for hiccoughs. 30 tablet 0 05/19/2016 at Unknown time  . clopidogrel (PLAVIX) 75 MG tablet Take 75 mg by mouth every morning.  11 05/19/2016 at Unknown time  . diphenhydramine-acetaminophen (TYLENOL PM) 25-500 MG TABS tablet Take 2 tablets by mouth at bedtime as needed (for sleep).   Past Month at Unknown time  . fluconazole (DIFLUCAN) 200 MG tablet Take 1 tablet (200 mg total) by mouth 2 (two) times daily. 7 tablet 0 05/19/2016 at Unknown time  . Potassium Chloride ER 20 MEQ TBCR Take 20 mEq by mouth every morning.  0 05/19/2016 at Unknown time   Scheduled: . aspirin  81 mg Oral q morning - 10a  . clopidogrel  75 mg Oral Daily  . dolutegravir  50 mg Oral Daily  . emtricitabine-tenofovir AF  1 tablet Oral Daily  . fluconazole (DIFLUCAN) IV  400 mg Intravenous Q24H  . heparin  5,000 Units Subcutaneous Q8H  . potassium chloride  20 mEq Oral Daily  . sulfamethoxazole-trimethoprim  1 tablet Oral Daily   Continuous: . sodium chloride 50 mL/hr at 05/23/16 0547   TKT:[id number], promethazine  Assesment:He was admitted with Candida esophagitis, AIDS, intractable hiccups, and recent stroke. He was dehydrated which is better. He is improving. He will need skilled care facility rehabilitation when we can find a bed Principal Problem:   Candida esophagitis ([place]) Active Problems:   HIV disease ([place])   HIV dementia ([place])   Fungal esophagitis   AIDS ([place])   Personal history of stroke with current residual effects    Dehydration   Hypokalemia    Plan: As above    LOS: 4 days   Taveon Enyeart L 05/23/2016, 10:14 AM

## 2016-05-23 NOTE — Clinical Social Work Note (Signed)
CSW spoke with patient's mother this morning regarding placement issue. Patient's mom expresses a lot of frustration with the placement process. CSW explained that unfortunately [facility] offered a bed to the patient without checking the patient's insurance out. CSW explained that we will have to look for another option, but prepared the patient's mother that sense the patient is medically stable we will have to move forward with the next available bed. She insists that he stays in [facility]. CSW explained that this is very unlikely. CSW will followup with bed offers once available.    Roddie Mc MSW, [place], [place], [phone]

## 2016-05-23 NOTE — Progress Notes (Signed)
Assumed care of pt. From off going RN. No changes in initial shift assessment. Pt resting quietly watching TV. Cont with current plan of care. 

## 2016-05-24 DIAGNOSIS — R066 Hiccough: Secondary | ICD-10-CM | POA: Diagnosis present

## 2016-05-24 NOTE — Progress Notes (Signed)
Subjective: He's more tired this morning. He did well with physical therapy yesterday.  Objective: Vital signs in last 24 hours: Temp:  [98.1 F (36.7 C)-100.1 F (37.8 C)] 100.1 F (37.8 C) (07/30 0639) Pulse Rate:  [74-96] 96 (07/30 0639) Resp:  [18-20] 18 (07/30 0639) BP: (120-140)/(76-86) 120/76 (07/30 0639) SpO2:  [98 %-100 %] 100 % (07/30 0938) Weight change:  Last BM Date: 05/20/16  Intake/Output from previous day: 07/29 0701 - 07/30 0700 In: 720 [P.O.:720] Out: 1800 [Urine:1800]  PHYSICAL EXAM General appearance: alert and With slurred speech and right hemiparesis. He is tired today. He is not having hiccups Resp: alert and Clear chest Cardio: regular rate and rhythm, S1, S2 normal, no murmur, click, rub or gallop GI: soft, non-tender; bowel sounds normal; no masses,  no organomegaly Extremities: extremities normal, atraumatic, no cyanosis or edema  Lab Results:  Results for orders placed or performed during the hospital encounter of 05/19/16 (from the past 48 hour(s))  Basic metabolic panel     Status: Abnormal   Collection Time: 05/23/16  6:19 AM  Result Value Ref Range   Sodium 142 135 - 145 mmol/[place]   Potassium 3.7 3.5 - 5.1 mmol/[place]   Chloride 115 (H) 101 - 111 mmol/[place]   CO2 22 22 - 32 mmol/[place]   Glucose, Bld 83 65 - 99 mg/dL   BUN 6 6 - 20 mg/dL   Creatinine, Ser 0.77 0.61 - 1.24 mg/dL   Calcium 8.8 ([place]) 8.9 - 10.3 mg/dL   GFR calc non Af Amer >60 >60 mL/min   GFR calc Af Amer >60 >60 mL/min    Comment: (NOTE) The eGFR has been calculated using the CKD EPI equation. This calculation has not been validated in all clinical situations. eGFR's persistently <60 mL/min signify possible Chronic Kidney Disease.    Anion gap 5 5 - 15    ABGS No results for input(s): PHART, PO2ART, TCO2, HCO3 in the last 72 hours.  Invalid input(s): PCO2 CULTURES No results found for this or any previous visit (from the past 240 hour(s)). Studies/Results: US Carotid  Bilateral  Result Date: 05/22/2016 CLINICAL DATA:  Stroke, vertigo and history of coronary artery disease. EXAM: BILATERAL CAROTID DUPLEX ULTRASOUND TECHNIQUE: Pearline Cables scale imaging, color Doppler and duplex ultrasound were performed of bilateral carotid and vertebral arteries in the neck. COMPARISON:  None. FINDINGS: Criteria: Quantification of carotid stenosis is based on velocity parameters that correlate the residual internal carotid diameter with NASCET-based stenosis levels, using the diameter of the distal internal carotid lumen as the denominator for stenosis measurement. The following velocity measurements were obtained: RIGHT ICA:  88/32 cm/sec CCA:  18/29 cm/sec SYSTOLIC ICA/CCA RATIO:  1.0 DIASTOLIC ICA/CCA RATIO:  1.8 ECA:  94 cm/sec LEFT ICA:  76/26 cm/sec CCA:  93/71 cm/sec SYSTOLIC ICA/CCA RATIO:  0.8 DIASTOLIC ICA/CCA RATIO:  1.3 ECA:  85 cm/sec RIGHT CAROTID ARTERY: There is a mild amount of partially calcified plaque at the level of the distal common carotid artery and carotid bulb extending into the proximal ICA. Velocities and waveforms are normal and estimated right ICA stenosis is less than 50%. RIGHT VERTEBRAL ARTERY: Antegrade flow with normal waveform and velocity. LEFT CAROTID ARTERY: There is a minimal amount of noncalcified plaque at the level of the distal carotid bulb and proximal ICA. Velocities and waveforms are unremarkable and estimated left ICA stenosis is less than 50%. LEFT VERTEBRAL ARTERY: Antegrade flow with normal waveform and velocity. IMPRESSION: Mild amount of plaque at the level  of both carotid bulbs and proximal internal carotid arteries, right greater than left. No significant carotid stenosis identified with estimated bilateral ICA stenoses of less than 50%. Electronically Signed   By: Aletta Edouard M.D.   On: 05/22/2016 11:45   Medications:  Prior to Admission:  Prescriptions Prior to Admission  Medication Sig Dispense Refill Last Dose  . aspirin 81 MG tablet  Take 81 mg by mouth every morning.   05/19/2016 at Unknown time  . atorvastatin (LIPITOR) 80 MG tablet Take 40 mg by mouth every morning.  11 05/19/2016 at Unknown time  . chlorproMAZINE (THORAZINE) 10 MG tablet Take 1 tablet (10 mg total) by mouth 3 (three) times daily as needed for hiccoughs. 30 tablet 0 05/19/2016 at Unknown time  . clopidogrel (PLAVIX) 75 MG tablet Take 75 mg by mouth every morning.  11 05/19/2016 at Unknown time  . diphenhydramine-acetaminophen (TYLENOL PM) 25-500 MG TABS tablet Take 2 tablets by mouth at bedtime as needed (for sleep).   Past Month at Unknown time  . Potassium Chloride ER 20 MEQ TBCR Take 20 mEq by mouth every morning.  0 05/19/2016 at Unknown time  . [DISCONTINUED] fluconazole (DIFLUCAN) 200 MG tablet Take 1 tablet (200 mg total) by mouth 2 (two) times daily. 7 tablet 0 05/19/2016 at Unknown time   Scheduled: . aspirin  81 mg Oral q morning - 10a  . clopidogrel  75 mg Oral Daily  . dolutegravir  50 mg Oral Daily  . emtricitabine-tenofovir AF  1 tablet Oral Daily  . fluconazole  400 mg Oral Daily  . heparin  5,000 Units Subcutaneous Q8H  . potassium chloride  20 mEq Oral Daily  . sulfamethoxazole-trimethoprim  1 tablet Oral Daily   Continuous:  VYX:[id number], promethazine  Assesment: He was admitted with Candida esophagitis dehydration hypokalemia and recent diagnosis of AIDS. He has a recent history of stroke with residual right hemiparesis. His esophagitis is getting better. He had significant trouble with intractable hiccups on admission but that's better as well. 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 Transfer to skilled care facility when bed is available    LOS: 5 days   Joseph Walton 05/24/2016, 9:52 AM

## 2016-05-24 NOTE — Progress Notes (Signed)
Physical Therapy Treatment Patient Details Name: Joseph Walton MRN: [id number] DOB: 11-Feb-1966 Today's Date: 05/24/2016    History of Present Illness 50 yo M admitted 05/19/2016 with consistent intractable hiccups, chest pain, and inability to sleep for 3 days.  Patient was seen in the ED 7/23 for the hiccups and chest pain. During exam he was noted to have oral candidiasis and was treated with Thorazine and discharged.  During this ED visit he was also diagnosed with HIV.  Hiccups have now returned.  Family reports mild memory loss over the last few months.  PMH: MI, CVA, recent HIV diagnosis, brain surgery, cardiac stents    PT Comments    Pt received in bed, and was agreeable to PT tx.  Pt expressed that he was tired today, however, overall he was much less verbal and interactive during today's PT tx.  Pt required increased level of assistance for all functional mobility today, likely due to increased lethargy today.  He was able to work on sitting balance on the EOB with lateral weight shifting, and then transfer bed<>chair with Mod/Max A.  Continue to recommend SNF.  Follow Up Recommendations  SNF     Equipment Recommendations  None recommended by PT    Recommendations for Other Services       Precautions / Restrictions Precautions Precautions: Fall Precaution Comments: Pt reports falling - not able to specify Restrictions Weight Bearing Restrictions: No    Mobility  Bed Mobility Overal bed mobility: Needs Assistance Bed Mobility: Supine to Sit     Supine to sit: Mod assist;HOB elevated     General bed mobility comments: increased assistance and vc's for task today, incresed leaning to the left again.    Transfers Overall transfer level: Needs assistance Equipment used: Right platform walker Transfers: Sit to/from Stand;Stand Pivot Transfers Sit to Stand: Mod assist Stand pivot transfers: Mod assist;Max assist       General transfer comment: Pt unable to  keep R knee extended during transfer today, and not following commands as well due to lethargy.   Ambulation/Gait Ambulation/Gait assistance:  (NA due to lethargy and increased assistance needed for bed mobility and transfers today. )               Stairs            Wheelchair Mobility    Modified Rankin (Stroke Patients Only)       Balance Overall balance assessment: Needs assistance Sitting-balance support: Bilateral upper extremity supported Sitting balance-Leahy Scale: Poor Sitting balance - Comments: lateral weight shifting sitting on the EOB with Mod A especially gong to the right today.  Pt demonstrates increased lean to the right today when sitting at the EOB.  Pt needed increased encouragement to engage in PT session today.    Standing balance support: Bilateral upper extremity supported Standing balance-Leahy Scale: Poor                      Cognition Arousal/Alertness: Lethargic;Suspect due to medications Behavior During Therapy:  (Pt is extremely lethargic today, does not speak as much to PT today.)                        Exercises      General Comments        Pertinent Vitals/Pain Pain Assessment: No/denies pain    Home Living  Prior Function            PT Goals (current goals can now be found in the care plan section) Acute Rehab PT Goals Patient Stated Goal: To get stronger PT Goal Formulation: With patient Time For Goal Achievement: 05/27/16 Potential to Achieve Goals: Good Progress towards PT goals: Not progressing toward goals - comment (due to lethargy today)    Frequency  7X/week    PT Plan Current plan remains appropriate    Co-evaluation             End of Session Equipment Utilized During Treatment: Gait belt Activity Tolerance: Patient limited by fatigue;Patient limited by lethargy Patient left: in chair     Time: 2703-5009 PT Time Calculation (min) (ACUTE ONLY):  32 min  Charges:  $Therapeutic Activity: 23-37 mins                    G Codes:      Beth Charlsie Fleeger, PT, DPT X: [phone]

## 2016-05-25 ENCOUNTER — Inpatient Hospital Stay
Admission: RE | Admit: 2016-05-25 | Discharge: 2016-06-09 | Disposition: A | Discharge status: Other Acute Inpatient Hospital | Payer: PRIVATE HEALTH INSURANCE | Source: Ambulatory Visit | Attending: Pulmonary Disease | Admitting: Pulmonary Disease

## 2016-05-25 LAB — CBC WITH DIFFERENTIAL/PLATELET
BASOS ABS: 0 10*3/uL (ref 0.0–0.1)
BASOS PCT: 0 %
Eosinophils Absolute: 0.1 10*3/uL (ref 0.0–0.7)
Eosinophils Relative: 1 %
HEMATOCRIT: 29.9 % — AB (ref 39.0–52.0)
Hemoglobin: 10.1 g/dL — ABNORMAL LOW (ref 13.0–17.0)
Lymphocytes Relative: 13 %
Lymphs Abs: 0.7 10*3/uL (ref 0.7–4.0)
MCH: 30.9 pg (ref 26.0–34.0)
MCHC: 33.8 g/dL (ref 30.0–36.0)
MCV: 91.4 fL (ref 78.0–100.0)
MONO ABS: 0.4 10*3/uL (ref 0.1–1.0)
Monocytes Relative: 8 %
NEUTROS ABS: 4.2 10*3/uL (ref 1.7–7.7)
NEUTROS PCT: 78 %
Platelets: 144 10*3/uL — ABNORMAL LOW (ref 150–400)
RBC: 3.27 MIL/uL — ABNORMAL LOW (ref 4.22–5.81)
RDW: 14.1 % (ref 11.5–15.5)
WBC: 5.4 10*3/uL (ref 4.0–10.5)

## 2016-05-25 LAB — CD4/CD8 (T-HELPER/T-SUPPRESSOR CELL)
CD4 absolute: 20 /uL — ABNORMAL LOW (ref 500–1900)
CD4%: 4 % — AB (ref 30.0–60.0)
CD8 T CELL ABS: 260 /uL (ref 230–1000)
CD8tox: 58 % — ABNORMAL HIGH (ref 15.0–40.0)
RATIO: 0.08 — AB (ref 1.0–3.0)
Total lymphocyte count: 450 /uL — ABNORMAL LOW (ref 1000–4000)

## 2016-05-25 LAB — BASIC METABOLIC PANEL
ANION GAP: 4 — AB (ref 5–15)
BUN: 16 mg/dL (ref 6–20)
CALCIUM: 8.8 mg/dL — AB (ref 8.9–10.3)
CO2: 23 mmol/L (ref 22–32)
Chloride: 110 mmol/L (ref 101–111)
Creatinine, Ser: 0.96 mg/dL (ref 0.61–1.24)
Glucose, Bld: 91 mg/dL (ref 65–99)
Potassium: 3.6 mmol/L (ref 3.5–5.1)
Sodium: 137 mmol/L (ref 135–145)

## 2016-05-25 NOTE — Progress Notes (Signed)
Discharged to [facility], out in stable condition via w/c, reported to L.Totten, LPN.

## 2016-05-25 NOTE — NC FL2 (Signed)
[place] MEDICAID FL2 LEVEL OF CARE SCREENING TOOL     IDENTIFICATION  Patient Name: Joseph Walton Birthdate: 06/11/1966 Sex: male Admission Date (Current Location): 05/19/2016  [facility] and [place] Number:  [employer] and Address:  [facility],  [address], [place] [address]      Provider Number: [id number]  Attending Physician Name and Address:  Kari Baars, MD  Relative Name and Phone Number:       Current Level of Care: Hospital Recommended Level of Care: Skilled Nursing Facility Prior Approval Number:    Date Approved/Denied:   PASRR Number: [id number]  Discharge Plan: SNF    Current Diagnoses: Patient Active Problem List   Diagnosis Date Noted  . Intractable hiccups 05/24/2016  . Dehydration 05/22/2016  . Hypokalemia 05/22/2016  . AIDS (HCC) 05/21/2016  . Personal history of stroke with current residual effects 05/21/2016  . Candida esophagitis (HCC) 05/19/2016  . HIV disease (HCC) 05/19/2016  . CVA (cerebral infarction) 05/19/2016  . 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 Fungal esophagitis 05/19/2016    Orientation RESPIRATION BLADDER Height & Weight     Self, Time, Situation, Place  Normal Continent Weight: 159 lb (72.1 kg) Height:  5\' 10"  (177.8 cm)  BEHAVIORAL SYMPTOMS/MOOD NEUROLOGICAL BOWEL NUTRITION STATUS      Continent  (Dysphagia 3 with nectar thick liquids)  AMBULATORY STATUS COMMUNICATION OF NEEDS Skin   Extensive Assist Verbally Other (Comment) (Excoriated left arm)                       Personal Care Assistance Level of Assistance  Bathing, Feeding, Dressing Bathing Assistance: Maximum assistance Feeding assistance: Maximum assistance Dressing Assistance: Maximum assistance     Functional Limitations Info  Sight, Hearing, Speech Sight Info: Adequate Hearing Info: Adequate Speech Info: Impaired (Slurred speech)    SPECIAL CARE FACTORS FREQUENCY  OT (By licensed OT), Speech therapy     PT  Frequency: 5x/week OT Frequency: 3x/week     Speech Therapy Frequency: 3x/week      Contractures Contractures Info: Not present    Additional Factors Info  Code Status Code Status Info: Full Code             Current Medications (05/25/2016):  This is the current hospital active medication list Current Facility-Administered Medications  Medication Dose Route Frequency Provider Last Rate Last Dose  . aspirin chewable tablet 81 mg  81 mg Oral q morning - 10a Houston Siren, MD   81 mg at 05/24/16 1000  . chlorproMAZINE (THORAZINE) tablet 10 mg  10 mg Oral TID PRN Houston Siren, MD   10 mg at 05/24/16 1746  . clopidogrel (PLAVIX) tablet 75 mg  75 mg Oral Daily Houston Siren, MD   75 mg at 05/24/16 1009  . dolutegravir (TIVICAY) tablet 50 mg  50 mg Oral Daily Kari Baars, MD   50 mg at 05/24/16 1000  . emtricitabine-tenofovir AF (DESCOVY) 200-25 MG per tablet 1 tablet  1 tablet Oral Daily Kari Baars, MD   1 tablet at 05/24/16 1009  . fluconazole (DIFLUCAN) tablet 400 mg  400 mg Oral Daily Kari Baars, MD   400 mg at 05/24/16 1000  . heparin injection 5,000 Units  5,000 Units Subcutaneous Q8H Houston Siren, MD   5,000 Units at 05/25/16 [id number]  . potassium chloride SA (K-DUR,KLOR-CON) CR tablet 20 mEq  20 mEq Oral Daily Kari Baars, MD   20 mEq at 05/24/16 1011  . promethazine (  PHENERGAN) tablet 12.5 mg  12.5 mg Oral Q6H PRN Houston Siren, MD   12.5 mg at 05/24/16 1242  . sulfamethoxazole-trimethoprim (BACTRIM DS,SEPTRA DS) 800-160 MG per tablet 1 tablet  1 tablet Oral Daily Kari Baars, MD   1 tablet at 05/24/16 1011     Discharge Medications: Please see discharge summary for a list of discharge medications.  Relevant Imaging Results:  Relevant Lab Results:   Additional Information    Karn Cassis, [place] [phone]

## 2016-05-25 NOTE — Progress Notes (Signed)
Subjective: He says he feels better today. He is less fatigued.  Objective: Vital signs in last 24 hours: Temp:  [97.7 F (36.5 C)-100.5 F (38.1 C)] 98 F (36.7 C) (07/31 0616) Pulse Rate:  [71-111] 80 (07/31 0616) Resp:  [18-20] 20 (07/31 0616) BP: (117-137)/(71-80) 117/71 (07/31 0616) SpO2:  [95 %-96 %] 96 % (07/31 0616) Weight change:  Last BM Date: 05/24/16  Intake/Output from previous day: 07/30 0701 - 07/31 0700 In: 360 [P.O.:360] Out: 400 [Urine:400]  PHYSICAL EXAM General appearance: alert, cooperative and mild distress Resp: clear to auscultation bilaterally Cardio: regular rate and rhythm, S1, S2 normal, no murmur, click, rub or gallop GI: soft, non-tender; bowel sounds normal; no masses,  no organomegaly Extremities: He has right hemiparesis His hemiparesis is about the same. He still has some facial asymmetry and slurred speech Lab Results:  Results for orders placed or performed during the hospital encounter of 05/19/16 (from the past 48 hour(s))  CBC with Differential/Platelet     Status: Abnormal   Collection Time: 05/25/16  6:22 AM  Result Value Ref Range   WBC 5.4 4.0 - 10.5 K/uL   RBC 3.27 (L) 4.22 - 5.81 MIL/uL   Hemoglobin 10.1 (L) 13.0 - 17.0 g/dL   HCT 29.9 (L) 39.0 - 52.0 %   MCV 91.4 78.0 - 100.0 fL   MCH 30.9 26.0 - 34.0 pg   MCHC 33.8 30.0 - 36.0 g/dL   RDW 14.1 11.5 - 15.5 %   Platelets 144 (L) 150 - 400 K/uL   Neutrophils Relative % 78 %   Neutro Abs 4.2 1.7 - 7.7 K/uL   Lymphocytes Relative 13 %   Lymphs Abs 0.7 0.7 - 4.0 K/uL   Monocytes Relative 8 %   Monocytes Absolute 0.4 0.1 - 1.0 K/uL   Eosinophils Relative 1 %   Eosinophils Absolute 0.1 0.0 - 0.7 K/uL   Basophils Relative 0 %   Basophils Absolute 0.0 0.0 - 0.1 K/uL  Basic metabolic panel     Status: Abnormal   Collection Time: 05/25/16  6:22 AM  Result Value Ref Range   Sodium 137 135 - 145 mmol/L   Potassium 3.6 3.5 - 5.1 mmol/L   Chloride 110 101 - 111 mmol/L   CO2 23  22 - 32 mmol/L   Glucose, Bld 91 65 - 99 mg/dL   BUN 16 6 - 20 mg/dL   Creatinine, Ser 0.96 0.61 - 1.24 mg/dL   Calcium 8.8 (L) 8.9 - 10.3 mg/dL   GFR calc non Af Amer >60 >60 mL/min   GFR calc Af Amer >60 >60 mL/min    Comment: (NOTE) The eGFR has been calculated using the CKD EPI equation. This calculation has not been validated in all clinical situations. eGFR's persistently <60 mL/min signify possible Chronic Kidney Disease.    Anion gap 4 (L) 5 - 15    ABGS No results for input(s): PHART, PO2ART, TCO2, HCO3 in the last 72 hours.  Invalid input(s): PCO2 CULTURES No results found for this or any previous visit (from the past 240 hour(s)). Studies/Results: No results found.  Medications:  Prior to Admission:  Prescriptions Prior to Admission  Medication Sig Dispense Refill Last Dose  . aspirin 81 MG tablet Take 81 mg by mouth every morning.   05/19/2016 at Unknown time  . atorvastatin (LIPITOR) 80 MG tablet Take 40 mg by mouth every morning.  11 05/19/2016 at Unknown time  . chlorproMAZINE (THORAZINE) 10 MG tablet Take 1 tablet (  10 mg total) by mouth 3 (three) times daily as needed for hiccoughs. 30 tablet 0 05/19/2016 at Unknown time  . clopidogrel (PLAVIX) 75 MG tablet Take 75 mg by mouth every morning.  11 05/19/2016 at Unknown time  . diphenhydramine-acetaminophen (TYLENOL PM) 25-500 MG TABS tablet Take 2 tablets by mouth at bedtime as needed (for sleep).   Past Month at Unknown time  . Potassium Chloride ER 20 MEQ TBCR Take 20 mEq by mouth every morning.  0 05/19/2016 at Unknown time  . [DISCONTINUED] fluconazole (DIFLUCAN) 200 MG tablet Take 1 tablet (200 mg total) by mouth 2 (two) times daily. 7 tablet 0 05/19/2016 at Unknown time   Scheduled: . aspirin  81 mg Oral q morning - 10a  . clopidogrel  75 mg Oral Daily  . dolutegravir  50 mg Oral Daily  . emtricitabine-tenofovir AF  1 tablet Oral Daily  . fluconazole  400 mg Oral Daily  . heparin  5,000 Units Subcutaneous Q8H   . potassium chloride  20 mEq Oral Daily  . sulfamethoxazole-trimethoprim  1 tablet Oral Daily   Continuous:  [id number]:[id number], promethazine  Assesment: He was admitted with Candida esophagitis. He was dehydrated. He had intractable hiccups. He also had a recent stroke and has right hemiparesis. He has AIDS which is recently diagnosed. He is improving. He is working with physical therapy speech therapy etc. 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

## 2016-05-25 NOTE — Clinical Social Work Placement (Signed)
   CLINICAL SOCIAL WORK PLACEMENT  NOTE  Date:  05/25/2016  Patient Details  Name: Joseph Walton MRN: [id number] Date of Birth: Apr 26, 1966  Clinical Social Work is seeking post-discharge placement for this patient at the Skilled  Nursing Facility level of care (*CSW will initial, date and re-position this form in  chart as items are completed):  Yes   Patient/family provided with [facility] Clinical Social Work Department's list of facilities offering this level of care within the geographic area requested by the patient (or if unable, by the patient's family).  Yes   Patient/family informed of their freedom to choose among providers that offer the needed level of care, that participate in Medicare, Medicaid or managed care program needed by the patient, have an available bed and are willing to accept the patient.  Yes   Patient/family informed of [facility]'s ownership interest in [facility] and [facility], as well as of the fact that they are under no obligation to receive care at these facilities.  PASRR submitted to EDS on 05/20/16     PASRR number received on 05/20/16     Existing PASRR number confirmed on       FL2 transmitted to all facilities in geographic area requested by pt/family on 05/20/16     FL2 transmitted to all facilities within larger geographic area on 05/22/16     Patient informed that his/her managed care company has contracts with or will negotiate with certain facilities, including the following:        Yes   Patient/family informed of bed offers received.  Patient chooses bed at [facility]     Physician recommends and patient chooses bed at      Patient to be transferred to [facility] on 05/25/16.  Patient to be transferred to facility by staff     Patient family notified on 05/25/16 of transfer.  Name of family member notified:  [place]- father     PHYSICIAN       Additional Comment:  Discussed with  supervisor and [facility] will accept pt with LOG. Financial counselor aware.   _______________________________________________ Karn Cassis, LCSW 05/25/2016, 10:46 AM [phone]

## 2016-05-26 ENCOUNTER — Telehealth (HOSPITAL_COMMUNITY): Payer: Self-pay

## 2016-05-27 ENCOUNTER — Ambulatory Visit (HOSPITAL_COMMUNITY): Payer: PRIVATE HEALTH INSURANCE

## 2016-05-27 ENCOUNTER — Ambulatory Visit (HOSPITAL_COMMUNITY): Payer: PRIVATE HEALTH INSURANCE | Admitting: Speech Pathology

## 2016-05-27 ENCOUNTER — Encounter (HOSPITAL_COMMUNITY): Payer: Self-pay

## 2016-05-28 ENCOUNTER — Encounter (HOSPITAL_COMMUNITY)
Admission: RE | Admit: 2016-05-28 | Discharge: 2016-05-28 | Disposition: A | Discharge status: Home / Self Care | Payer: PRIVATE HEALTH INSURANCE | Source: Skilled Nursing Facility | Attending: Internal Medicine | Admitting: Internal Medicine

## 2016-05-28 DIAGNOSIS — B20 Human immunodeficiency virus [HIV] disease: Secondary | ICD-10-CM | POA: Insufficient documentation

## 2016-05-28 LAB — BASIC METABOLIC PANEL
Anion gap: 7 (ref 5–15)
BUN: 15 mg/dL (ref 6–20)
CO2: 24 mmol/L (ref 22–32)
Calcium: 9.8 mg/dL (ref 8.9–10.3)
Chloride: 106 mmol/L (ref 101–111)
Creatinine, Ser: 0.89 mg/dL (ref 0.61–1.24)
GFR calc Af Amer: >60 mL/min (ref >60)
GFR calc non Af Amer: >60 mL/min (ref >60)
Glucose, Bld: 97 mg/dL (ref 65–99)
Potassium: 4.1 mmol/L (ref 3.5–5.1)
Sodium: 137 mmol/L (ref 135–145)

## 2016-05-30 LAB — HIV-1 RNA ULTRAQUANT REFLEX TO GENTYP+
HIV-1 RNA BY PCR: 885000 {copies}/mL
HIV-1 RNA QUANT, LOG: 5.947 {Log_copies}/mL

## 2016-05-30 LAB — REFLEX TO GENOSURE(R) MG: HIV GenoSure(R) MG PDF: 0

## 2016-06-02 ENCOUNTER — Ambulatory Visit (HOSPITAL_COMMUNITY): Payer: PRIVATE HEALTH INSURANCE | Admitting: Physical Therapy

## 2016-06-02 ENCOUNTER — Encounter (HOSPITAL_COMMUNITY): Payer: Self-pay

## 2016-06-05 ENCOUNTER — Telehealth: Payer: Self-pay

## 2016-06-05 NOTE — Telephone Encounter (Signed)
I spoke with patient's father who stated Joseph Walton  is currently admitted at [facility] hospital and is not  Conscious. The family has been told to expect the worst and anticipate Hospice referral.  Currently the family is very overloaded with stress because they were not aware he was this sick and was HIV positive.   I explained I was calling from Infectious Disease and wanted to ensure he was in care and offered my condolences.   Laurell Josephsammy K King, RN

## 2016-06-05 NOTE — Progress Notes (Signed)
      INFECTIOUS DISEASE ATTENDING ADDENDUM:   Date: 06/05/2016  Patient name: Joseph Walton  Medical record number: [id number]  Date of birth: September 19, 1966    I called [facility] at [phone]  Joseph Walton HAS been started on Tivicay and Descovy along with Bactrim for PCP prophylaxis and is being treated for thrush  Per RN staff he is impoving.  He MOST DEFINITELY needs to be seen by us in the next few weeks. The most important thing is that he has meds  Tammy can you check with their RN staff with re to how long he will be at [facility], I dont want to give him a chance to fall out of care again    Baxter InternationalCornelius Van Dam 06/05/2016, 3:41 PM

## 2016-06-05 NOTE — Telephone Encounter (Signed)
I didn't knwo about this person. He should be evaluated by us and started on ARV therapy

## 2016-06-06 NOTE — H&P (Signed)
Joseph Walton MRN: [id number] DOB/AGE: 1966-01-03 50 y.o. Primary Care Physician:Crue Otero L, MD Admit date: 05/25/2016 Chief Complaint: Stroke/AIDS HPI: This is a 50 year old who had been in his usual state of health when about 6 weeks or so ago he had a stroke. He did not have medical insurance at that time and did not seek care. He later came to my office and was started on workup. He had been living with his parents. He had more problems with swallowing had what appeared to be thrush was taken to the emergency department and was positive for HIV. He had thrush is an AIDS defining illness. He did not do well after that emergency department visit had failure to thrive at home and eventually was admitted to the hospital. He was treated with IV medications for thrush. He had laboratory work to define where he was as far as his AIDS. He had physical therapy speech therapy etc. He was sent to skilled care facility for rehabilitation. He has deteriorated. His speech is worse. His swallowing is worse. 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 He is not recognizing his family now  Past Medical History:  Diagnosis Date  . MI (myocardial infarction) (HCC)   . Stroke [facility])    Past Surgical History:  Procedure Laterality Date  . BRAIN SURGERY    . CARDIAC CATHETERIZATION    . cardiac stents          No family history on file.  Social History:  reports that he has never smoked. He has never used smokeless tobacco. He reports that he does not drink alcohol or use drugs.   Allergies: No Known Allergies  Medications Prior to Admission  Medication Sig Dispense Refill  . aspirin 81 MG tablet Take 81 mg by mouth every morning.    Marland Kitchen atorvastatin (LIPITOR) 80 MG tablet Take 40 mg by mouth every morning.  11  . chlorproMAZINE (THORAZINE) 10 MG tablet Take 1 tablet (10 mg total) by mouth 3 (three) times daily as needed for hiccoughs. 30 tablet 0  . clopidogrel  (PLAVIX) 75 MG tablet Take 75 mg by mouth every morning.  11  . diphenhydramine-acetaminophen (TYLENOL PM) 25-500 MG TABS tablet Take 2 tablets by mouth at bedtime as needed (for sleep).    . dolutegravir (TIVICAY) 50 MG tablet Take 1 tablet (50 mg total) by mouth daily. 30 tablet   . emtricitabine-tenofovir AF (DESCOVY) 200-25 MG tablet Take 1 tablet by mouth daily. 30 tablet   . fluconazole (DIFLUCAN) 200 MG tablet Take 2 tablets (400 mg total) by mouth daily. 7 tablet 0  . Potassium Chloride ER 20 MEQ TBCR Take 20 mEq by mouth every morning.  0  . promethazine (PHENERGAN) 12.5 MG tablet Take 1 tablet (12.5 mg total) by mouth every 6 (six) hours as needed for nausea (Or for hiccups.). 30 tablet 0  . sulfamethoxazole-trimethoprim (BACTRIM DS,SEPTRA DS) 800-160 MG tablet Take 1 tablet by mouth daily.         WUJ:WJXBJ from the symptoms mentioned above,there are no other symptoms referable to all systems reviewed.  Physical Exam: There were no vitals taken for this visit. He looks at me but does not speak. He still has right hemiparesis. Pupils are reactive nose and throat are clear he has right facial asymmetry. His neck is supple without masses. Chest is clear. Heart is regular without gallop. His abdomen is soft extremities show no edema. As mentioned he does not really  respond. He does not speak.   No results for input(s): WBC, NEUTROABS, HGB, HCT, MCV, PLT in the last 72 hours. No results for input(s): NA, K, CL, CO2, GLUCOSE, BUN, CREATININE, CALCIUM, MG in the last 72 hours.  Invalid input(s): PHOlablast2(ast:2,ALT:2,alkphos:2,bilitot:2,prot:2,albumin:2)@    No results found for this or any previous visit (from the past 240 hour(s)).   Dg Chest 2 View  Result Date: 05/17/2016 CLINICAL DATA:  Hiccups for over 12 hours. Status post fall this morning. EXAM: CHEST  2 VIEW COMPARISON:  PA and lateral chest 08/30/2009. FINDINGS: Lungs are clear. Heart size is normal. No pneumothorax or  pleural effusion. No bony abnormality. IMPRESSION: Negative chest. Electronically Signed   By: Drusilla Kanner M.D.   On: 05/17/2016 17:23  Ct Head Wo Contrast  Result Date: 05/12/2016 CLINICAL DATA:  Development of slurred speech over the last several months. Possible stroke. EXAM: CT HEAD WITHOUT CONTRAST TECHNIQUE: Contiguous axial images were obtained from the base of the skull through the vertex without intravenous contrast. COMPARISON:  None. FINDINGS: There is a late subacute to old cortical and subcortical infarction in the right posterior frontal cortical and subcortical brain. There is probably an infarction in the right lateral thalamus/ posterior limb internal capsule which could be old or subacute. No sign of mass lesion, hemorrhage, hydrocephalus or extra-axial collection. The calvarium is unremarkable. Sinuses, middle ears and mastoids are clear. IMPRESSION: Late subacute or old cortical and subcortical infarction in the right posterior frontal lobe. Probable similar age infarction at the right lateral thalamus/posterior limb internal capsule. No sign of hemorrhage or mass effect. Electronically Signed   By: Paulina Fusi M.D.   On: 05/12/2016 07:24   US Carotid Bilateral  Result Date: 05/22/2016 CLINICAL DATA:  Stroke, vertigo and history of coronary artery disease. EXAM: BILATERAL CAROTID DUPLEX ULTRASOUND TECHNIQUE: Wallace Cullens scale imaging, color Doppler and duplex ultrasound were performed of bilateral carotid and vertebral arteries in the neck. COMPARISON:  None. FINDINGS: Criteria: Quantification of carotid stenosis is based on velocity parameters that correlate the residual internal carotid diameter with NASCET-based stenosis levels, using the diameter of the distal internal carotid lumen as the denominator for stenosis measurement. The following velocity measurements were obtained: RIGHT ICA:  88/32 cm/sec CCA:  88/20 cm/sec SYSTOLIC ICA/CCA RATIO:  1.0 DIASTOLIC ICA/CCA RATIO:  1.8 ECA:   94 cm/sec LEFT ICA:  76/26 cm/sec CCA:  90/20 cm/sec SYSTOLIC ICA/CCA RATIO:  0.8 DIASTOLIC ICA/CCA RATIO:  1.3 ECA:  85 cm/sec RIGHT CAROTID ARTERY: There is a mild amount of partially calcified plaque at the level of the distal common carotid artery and carotid bulb extending into the proximal ICA. Velocities and waveforms are normal and estimated right ICA stenosis is less than 50%. RIGHT VERTEBRAL ARTERY: Antegrade flow with normal waveform and velocity. LEFT CAROTID ARTERY: There is a minimal amount of noncalcified plaque at the level of the distal carotid bulb and proximal ICA. Velocities and waveforms are unremarkable and estimated left ICA stenosis is less than 50%. LEFT VERTEBRAL ARTERY: Antegrade flow with normal waveform and velocity. IMPRESSION: Mild amount of plaque at the level of both carotid bulbs and proximal internal carotid arteries, right greater than left. No significant carotid stenosis identified with estimated bilateral ICA stenoses of less than 50%. Electronically Signed   By: Irish Lack M.D.   On: 05/22/2016 11:45  Impression: He has had a stroke. He has a relatively new diagnosis of AIDS. He seems to be worsening rather than improving. He is  having more trouble with swallowing. He seems to be having more trouble with what seems to be age-related dementia. Active Problems:   * No active hospital problems. *     Plan: Continue treatments. I discussed his situation with his family at my office. They're considering hospice care.      Satoya Feeley L   06/06/2016, 11:04 AM

## 2016-06-09 ENCOUNTER — Inpatient Hospital Stay (HOSPITAL_COMMUNITY)
Admission: EM | Admit: 2016-06-09 | Discharge: 2016-06-10 | DRG: 974 | Disposition: A | Discharge status: Hospice | Payer: PRIVATE HEALTH INSURANCE | Attending: Pulmonary Disease | Admitting: Pulmonary Disease

## 2016-06-09 ENCOUNTER — Ambulatory Visit: Payer: PRIVATE HEALTH INSURANCE

## 2016-06-09 ENCOUNTER — Encounter (HOSPITAL_COMMUNITY): Payer: Self-pay | Admitting: *Deleted

## 2016-06-09 ENCOUNTER — Emergency Department (HOSPITAL_COMMUNITY): Payer: PRIVATE HEALTH INSURANCE

## 2016-06-09 DIAGNOSIS — Z7189 Other specified counseling: Secondary | ICD-10-CM

## 2016-06-09 DIAGNOSIS — N19 Unspecified kidney failure: Secondary | ICD-10-CM

## 2016-06-09 DIAGNOSIS — Z8673 Personal history of transient ischemic attack (TIA), and cerebral infarction without residual deficits: Secondary | ICD-10-CM | POA: Diagnosis not present

## 2016-06-09 DIAGNOSIS — R6520 Severe sepsis without septic shock: Secondary | ICD-10-CM | POA: Diagnosis present

## 2016-06-09 DIAGNOSIS — B3781 Candidal esophagitis: Secondary | ICD-10-CM

## 2016-06-09 DIAGNOSIS — Z515 Encounter for palliative care: Secondary | ICD-10-CM | POA: Diagnosis not present

## 2016-06-09 DIAGNOSIS — N170 Acute kidney failure with tubular necrosis: Secondary | ICD-10-CM

## 2016-06-09 DIAGNOSIS — B20 Human immunodeficiency virus [HIV] disease: Secondary | ICD-10-CM | POA: Diagnosis present

## 2016-06-09 DIAGNOSIS — I252 Old myocardial infarction: Secondary | ICD-10-CM | POA: Diagnosis not present

## 2016-06-09 DIAGNOSIS — Z7982 Long term (current) use of aspirin: Secondary | ICD-10-CM

## 2016-06-09 DIAGNOSIS — Z7902 Long term (current) use of antithrombotics/antiplatelets: Secondary | ICD-10-CM | POA: Diagnosis not present

## 2016-06-09 DIAGNOSIS — J690 Pneumonitis due to inhalation of food and vomit: Secondary | ICD-10-CM | POA: Diagnosis present

## 2016-06-09 DIAGNOSIS — N179 Acute kidney failure, unspecified: Secondary | ICD-10-CM

## 2016-06-09 DIAGNOSIS — I69351 Hemiplegia and hemiparesis following cerebral infarction affecting right dominant side: Secondary | ICD-10-CM | POA: Diagnosis not present

## 2016-06-09 DIAGNOSIS — E875 Hyperkalemia: Secondary | ICD-10-CM

## 2016-06-09 DIAGNOSIS — I6930 Unspecified sequelae of cerebral infarction: Secondary | ICD-10-CM

## 2016-06-09 DIAGNOSIS — F0280 Dementia in other diseases classified elsewhere without behavioral disturbance: Secondary | ICD-10-CM | POA: Diagnosis present

## 2016-06-09 DIAGNOSIS — R627 Adult failure to thrive: Secondary | ICD-10-CM | POA: Diagnosis present

## 2016-06-09 DIAGNOSIS — A419 Sepsis, unspecified organism: Secondary | ICD-10-CM | POA: Diagnosis not present

## 2016-06-09 DIAGNOSIS — R0602 Shortness of breath: Secondary | ICD-10-CM | POA: Diagnosis not present

## 2016-06-09 DIAGNOSIS — Z955 Presence of coronary angioplasty implant and graft: Secondary | ICD-10-CM

## 2016-06-09 DIAGNOSIS — E860 Dehydration: Secondary | ICD-10-CM | POA: Diagnosis present

## 2016-06-09 DIAGNOSIS — Z66 Do not resuscitate: Secondary | ICD-10-CM | POA: Diagnosis present

## 2016-06-09 HISTORY — DX: Human immunodeficiency virus (HIV) disease: B20

## 2016-06-09 LAB — CBC WITH DIFFERENTIAL/PLATELET
BASOS ABS: 0 10*3/uL (ref 0.0–0.1)
BASOS PCT: 0 %
BASOS PCT: 0 %
Basophils Absolute: 0 10*3/uL (ref 0.0–0.1)
EOS ABS: 0 10*3/uL (ref 0.0–0.7)
EOS ABS: 0 10*3/uL (ref 0.0–0.7)
EOS PCT: 0 %
EOS PCT: 0 %
HCT: 40.1 % (ref 39.0–52.0)
HCT: 36.8 % — ABNORMAL LOW (ref 39.0–52.0)
Hemoglobin: 14.1 g/dL (ref 13.0–17.0)
Hemoglobin: 12.2 g/dL — ABNORMAL LOW (ref 13.0–17.0)
LYMPHS ABS: 1 10*3/uL (ref 0.7–4.0)
LYMPHS PCT: 6 %
Lymphocytes Relative: 10 %
Lymphs Abs: 0.7 10*3/uL (ref 0.7–4.0)
MCH: 30.4 pg (ref 26.0–34.0)
MCH: 32 pg (ref 26.0–34.0)
MCHC: 33.2 g/dL (ref 30.0–36.0)
MCHC: 35.2 g/dL (ref 30.0–36.0)
MCV: 91.1 fL (ref 78.0–100.0)
MCV: 91.8 fL (ref 78.0–100.0)
MONO ABS: 0.6 10*3/uL (ref 0.1–1.0)
MONO ABS: 0.8 10*3/uL (ref 0.1–1.0)
MONOS PCT: 5 %
Monocytes Relative: 7 %
Neutro Abs: 8.9 10*3/uL — ABNORMAL HIGH (ref 1.7–7.7)
Neutro Abs: 9.9 10*3/uL — ABNORMAL HIGH (ref 1.7–7.7)
Neutrophils Relative %: 85 %
Neutrophils Relative %: 87 %
PLATELETS: 199 10*3/uL (ref 150–400)
PLATELETS: 276 10*3/uL (ref 150–400)
RBC: 4.4 MIL/uL (ref 4.22–5.81)
RBC: 4.01 MIL/uL — ABNORMAL LOW (ref 4.22–5.81)
RDW: 13.7 % (ref 11.5–15.5)
RDW: 13.7 % (ref 11.5–15.5)
WBC: 10.6 10*3/uL — ABNORMAL HIGH (ref 4.0–10.5)
WBC: 11.4 10*3/uL — ABNORMAL HIGH (ref 4.0–10.5)

## 2016-06-09 LAB — COMPREHENSIVE METABOLIC PANEL
ALBUMIN: 2.9 g/dL — AB (ref 3.5–5.0)
ALK PHOS: 119 U/L (ref 38–126)
ALK PHOS: 93 U/L (ref 38–126)
ALT: 28 U/L (ref 17–63)
ALT: 33 U/L (ref 17–63)
AST: 33 U/L (ref 15–41)
AST: 38 U/L (ref 15–41)
Albumin: 3.8 g/dL (ref 3.5–5.0)
Anion gap: 15 (ref 5–15)
Anion gap: 11 (ref 5–15)
BILIRUBIN TOTAL: 0.4 mg/dL (ref 0.3–1.2)
BUN: 88 mg/dL — AB (ref 6–20)
BUN: 92 mg/dL — AB (ref 6–20)
CALCIUM: 10.1 mg/dL (ref 8.9–10.3)
CALCIUM: 11.7 mg/dL — AB (ref 8.9–10.3)
CHLORIDE: 102 mmol/L (ref 101–111)
CO2: 14 mmol/L — AB (ref 22–32)
CO2: 20 mmol/L — ABNORMAL LOW (ref 22–32)
CREATININE: 2.78 mg/dL — AB (ref 0.61–1.24)
CREATININE: 3.29 mg/dL — AB (ref 0.61–1.24)
Chloride: 107 mmol/L (ref 101–111)
GFR calc Af Amer: 29 mL/min — ABNORMAL LOW (ref >60)
GFR calc non Af Amer: 20 mL/min — ABNORMAL LOW (ref >60)
GFR calc non Af Amer: 25 mL/min — ABNORMAL LOW (ref >60)
GFR, EST AFRICAN AMERICAN: 24 mL/min — AB (ref >60)
GLUCOSE: 117 mg/dL — AB (ref 65–99)
Glucose, Bld: 166 mg/dL — ABNORMAL HIGH (ref 65–99)
Potassium: 6.7 mmol/L (ref 3.5–5.1)
Potassium: 5.1 mmol/L (ref 3.5–5.1)
Sodium: 131 mmol/L — ABNORMAL LOW (ref 135–145)
Sodium: 138 mmol/L (ref 135–145)
TOTAL PROTEIN: 8 g/dL (ref 6.5–8.1)
Total Bilirubin: 0.4 mg/dL (ref 0.3–1.2)
Total Protein: 10.4 g/dL — ABNORMAL HIGH (ref 6.5–8.1)

## 2016-06-09 LAB — URINALYSIS, ROUTINE W REFLEX MICROSCOPIC
Glucose, UA: NEGATIVE mg/dL
HGB URINE DIPSTICK: NEGATIVE
KETONES UR: NEGATIVE mg/dL
Leukocytes, UA: NEGATIVE
NITRITE: NEGATIVE
Protein, ur: NEGATIVE mg/dL
pH: 5 (ref 5.0–8.0)

## 2016-06-09 LAB — LACTIC ACID, PLASMA
LACTIC ACID, VENOUS: 4.1 mmol/L — AB (ref 0.5–1.9)
Lactic Acid, Venous: 2.5 mmol/L (ref 0.5–1.9)

## 2016-06-09 LAB — I-STAT CG4 LACTIC ACID, ED: Lactic Acid, Venous: 5.12 mmol/L (ref 0.5–1.9)

## 2016-06-09 LAB — TSH: TSH: 0.511 u[IU]/mL (ref 0.350–4.500)

## 2016-06-09 LAB — TROPONIN I: Troponin I: 0.03 ng/mL (ref <0.03)

## 2016-06-09 LAB — MRSA PCR SCREENING: MRSA by PCR: POSITIVE — AB

## 2016-06-09 MED ORDER — CETYLPYRIDINIUM CHLORIDE 0.05 % MT LIQD
7.0000 mL | Freq: Two times a day (BID) | OROMUCOSAL | Status: DC
  Administered 2016-06-09 – 2016-06-10 (×3): 7 mL via OROMUCOSAL

## 2016-06-09 MED ORDER — MORPHINE SULFATE (PF) 2 MG/ML IV SOLN
1.0000 mg | INTRAVENOUS | Status: DC | PRN
Start: 2016-06-09 — End: 2016-06-10
  Administered 2016-06-09 – 2016-06-10 (×3): 1 mg via INTRAVENOUS
  Filled 2016-06-09 (×3): qty 1

## 2016-06-09 MED ORDER — DEXTROSE 5 % IV SOLN
2.0000 g | Freq: Once | INTRAVENOUS | Status: AC
  Administered 2016-06-09: 2 g via INTRAVENOUS
  Filled 2016-06-09: qty 2

## 2016-06-09 MED ORDER — SODIUM POLYSTYRENE SULFONATE 15 GM/60ML PO SUSP
30.0000 g | Freq: Once | ORAL | Status: AC
  Administered 2016-06-09: 30 g via RECTAL
  Filled 2016-06-09: qty 120

## 2016-06-09 MED ORDER — VANCOMYCIN HCL IN DEXTROSE 1-5 GM/200ML-% IV SOLN
1000.0000 mg | Freq: Once | INTRAVENOUS | Status: AC
Start: 2016-06-09 — End: 2016-06-09
  Administered 2016-06-09: 1000 mg via INTRAVENOUS
  Filled 2016-06-09: qty 200

## 2016-06-09 MED ORDER — SODIUM CHLORIDE 0.9 % IV BOLUS (SEPSIS)
1000.0000 mL | Freq: Once | INTRAVENOUS | Status: AC
Start: 2016-06-09 — End: 2016-06-09
  Administered 2016-06-09: 1000 mL via INTRAVENOUS

## 2016-06-09 MED ORDER — METOCLOPRAMIDE HCL 5 MG/ML IJ SOLN: 5.0000 mg | Freq: Three times a day (TID) | INTRAMUSCULAR | Status: DC | PRN

## 2016-06-09 MED ORDER — ACETAMINOPHEN 650 MG RE SUPP
650.0000 mg | RECTAL | Status: DC | PRN
  Administered 2016-06-09: 650 mg via RECTAL
  Filled 2016-06-09: qty 1

## 2016-06-09 MED ORDER — INSULIN ASPART 100 UNIT/ML IV SOLN
10.0000 [IU] | Freq: Once | INTRAVENOUS | Status: AC
  Administered 2016-06-09: 10 [IU] via INTRAVENOUS

## 2016-06-09 MED ORDER — FLUCONAZOLE IN SODIUM CHLORIDE 200-0.9 MG/100ML-% IV SOLN
INTRAVENOUS | Status: AC
  Filled 2016-06-09: qty 100

## 2016-06-09 MED ORDER — MUPIROCIN 2 % EX OINT
1.0000 "application " | TOPICAL_OINTMENT | Freq: Two times a day (BID) | CUTANEOUS | Status: DC
  Administered 2016-06-09 – 2016-06-10 (×3): 1 via NASAL
  Filled 2016-06-09 (×2): qty 22

## 2016-06-09 MED ORDER — SODIUM POLYSTYRENE SULFONATE 15 GM/60ML PO SUSP
60.0000 g | Freq: Once | ORAL | Status: DC
Start: 2016-06-09 — End: 2016-06-09

## 2016-06-09 MED ORDER — FLUCONAZOLE IN SODIUM CHLORIDE 100-0.9 MG/50ML-% IV SOLN
50.0000 mg | INTRAVENOUS | Status: DC
  Filled 2016-06-09: qty 25

## 2016-06-09 MED ORDER — SODIUM CHLORIDE 0.9 % IV SOLN
12.5000 mg | Freq: Four times a day (QID) | INTRAVENOUS | Status: DC | PRN
  Administered 2016-06-09 (×2): 12.5 mg via INTRAVENOUS
  Filled 2016-06-09 (×3): qty 0.5

## 2016-06-09 MED ORDER — SODIUM BICARBONATE 8.4 % IV SOLN
50.0000 meq | Freq: Once | INTRAVENOUS | Status: AC
  Administered 2016-06-09: 50 meq via INTRAVENOUS
  Filled 2016-06-09: qty 50

## 2016-06-09 MED ORDER — VANCOMYCIN HCL IN DEXTROSE 1-5 GM/200ML-% IV SOLN
1000.0000 mg | INTRAVENOUS | Status: DC
  Administered 2016-06-09: 1000 mg via INTRAVENOUS
  Filled 2016-06-09: qty 200

## 2016-06-09 MED ORDER — SODIUM CHLORIDE 0.9 % IV SOLN
INTRAVENOUS | Status: DC
  Administered 2016-06-09 – 2016-06-10 (×3): via INTRAVENOUS

## 2016-06-09 MED ORDER — CHLORHEXIDINE GLUCONATE CLOTH 2 % EX PADS
6.0000 | MEDICATED_PAD | Freq: Every day | CUTANEOUS | Status: DC
  Administered 2016-06-10: 6 via TOPICAL

## 2016-06-09 MED ORDER — INSULIN ASPART 100 UNIT/ML ~~LOC~~ SOLN
SUBCUTANEOUS | Status: AC
  Administered 2016-06-09: 10 [IU] via INTRAVENOUS
  Filled 2016-06-09: qty 1

## 2016-06-09 MED ORDER — FLUCONAZOLE IN SODIUM CHLORIDE 200-0.9 MG/100ML-% IV SOLN
200.0000 mg | INTRAVENOUS | Status: DC
  Administered 2016-06-10: 200 mg via INTRAVENOUS
  Filled 2016-06-09: qty 100

## 2016-06-09 MED ORDER — SODIUM CHLORIDE 0.9 % IV BOLUS (SEPSIS)
250.0000 mL | Freq: Once | INTRAVENOUS | Status: AC
Start: 2016-06-09 — End: 2016-06-09
  Administered 2016-06-09: 250 mL via INTRAVENOUS

## 2016-06-09 MED ORDER — CHLORPROMAZINE HCL 50 MG/2ML IJ SOLN
INTRAMUSCULAR | Status: AC
Start: 2016-06-09 — End: 2016-06-09
  Filled 2016-06-09: qty 2

## 2016-06-09 MED ORDER — CHLORPROMAZINE HCL 25 MG PO TABS: 25.0000 mg | ORAL_TABLET | Freq: Three times a day (TID) | ORAL | Status: DC | PRN

## 2016-06-09 MED ORDER — CEFEPIME HCL 1 G IJ SOLR
1.0000 g | INTRAMUSCULAR | Status: DC
  Administered 2016-06-09: 1 g via INTRAVENOUS
  Filled 2016-06-09: qty 1

## 2016-06-09 MED ORDER — HEPARIN SODIUM (PORCINE) 5000 UNIT/ML IJ SOLN
5000.0000 [IU] | Freq: Three times a day (TID) | INTRAMUSCULAR | Status: DC
  Administered 2016-06-09 – 2016-06-10 (×4): 5000 [IU] via SUBCUTANEOUS
  Filled 2016-06-09 (×4): qty 1

## 2016-06-09 MED ORDER — SODIUM CHLORIDE 0.9 % IV BOLUS (SEPSIS)
1000.0000 mL | Freq: Once | INTRAVENOUS | Status: AC
  Administered 2016-06-09: 1000 mL via INTRAVENOUS

## 2016-06-09 MED ORDER — DEXTROSE 50 % IV SOLN
INTRAVENOUS | Status: AC
  Administered 2016-06-09: 50 mL via INTRAVENOUS
  Filled 2016-06-09: qty 50

## 2016-06-09 MED ORDER — MORPHINE SULFATE (PF) 2 MG/ML IV SOLN
1.0000 mg | INTRAVENOUS | Status: DC | PRN
  Administered 2016-06-09: 1 mg via INTRAVENOUS
  Filled 2016-06-09: qty 1

## 2016-06-09 MED ORDER — CHLORPROMAZINE HCL 50 MG/2ML IJ SOLN
INTRAMUSCULAR | Status: AC
  Filled 2016-06-09: qty 2

## 2016-06-09 MED ORDER — SODIUM CHLORIDE 0.9% FLUSH
3.0000 mL | Freq: Two times a day (BID) | INTRAVENOUS | Status: DC
  Administered 2016-06-09 – 2016-06-10 (×3): 3 mL via INTRAVENOUS

## 2016-06-09 MED ORDER — SODIUM CHLORIDE 0.9 % IV SOLN
150.0000 mg | Freq: Once | INTRAVENOUS | Status: DC
  Filled 2016-06-09: qty 150

## 2016-06-09 MED ORDER — FLUCONAZOLE IN SODIUM CHLORIDE 100-0.9 MG/50ML-% IV SOLN
100.0000 mg | INTRAVENOUS | Status: DC
  Filled 2016-06-09: qty 50

## 2016-06-09 MED ORDER — FLUCONAZOLE IN SODIUM CHLORIDE 200-0.9 MG/100ML-% IV SOLN
200.0000 mg | Freq: Once | INTRAVENOUS | Status: AC
  Administered 2016-06-09: 200 mg via INTRAVENOUS
  Filled 2016-06-09: qty 100

## 2016-06-09 MED ORDER — DEXTROSE 50 % IV SOLN
1.0000 | Freq: Once | INTRAVENOUS | Status: AC
  Administered 2016-06-09: 50 mL via INTRAVENOUS

## 2016-06-09 NOTE — Progress Notes (Signed)
Pharmacy Antibiotic Note  Joseph Walton is a 50 y.o. male admitted on 06/09/2016 with sepsis and potential esophageal candidiasis.  Pharmacy has been consulted for Vancomycin, Cefepime and Fluconazole dosing.  Plan: Initial dose given Vancomycin 1gm IV every 24 hours.  Goal trough 15-20 mcg/mL.  Cefepime 1gm IV every 24 hours. Fluconazole 200mg  IV every 24 hours. Monitor labs, micro and vitals.   Height: 5\' 10"  (177.8 cm) Weight: 144 lb 13.5 oz (65.7 kg) IBW/kg (Calculated) : 73  Temp (24hrs), Avg:98.9 F (37.2 C), Min:97.8 F (36.6 C), Max:99.5 F (37.5 C)   Recent Labs Lab 06/09/16 0034 06/09/16 0054 06/09/16 0440 06/09/16 0720  WBC 10.6*  --  11.4*  --   CREATININE 3.29*  --  2.78*  --   LATICACIDVEN  --  5.12* 4.1* 2.5*    Estimated Creatinine Clearance: 29.5 mL/min (by C-G formula based on SCr of 2.78 mg/dL).    No Known Allergies  Antimicrobials this admission: Vanc 8/15 >>  Cefepime 8/15 >>  Fluconazole 8/15 >>   Dose adjustments this admission: Monitor renal function, levels  Microbiology results: 8/15 BCx: pending 8/15 UCx: pending   8/15 MRSA PCR: (+)  Thank you for allowing pharmacy to be a part of this patient's care.  Joseph Walton, Joseph Walton 06/09/2016 8:17 AM

## 2016-06-09 NOTE — ED Notes (Signed)
Admitting MD at bedside.

## 2016-06-09 NOTE — ED Notes (Signed)
MD at bedside, speaking with pt parents about pt plan of care.

## 2016-06-09 NOTE — ED Provider Notes (Signed)
[facility]-EMERGENCY DEPT Provider Note   CSN: [id number] Arrival date & time: 06/09/16  0020  By signing my name below, I, Vista Minkobert Ross, attest that this documentation has been prepared under the direction and in the presence of Shon Batonourtney F Horton, MD. Electronically signed, Vista Minkobert Ross, ED Scribe. 06/09/16. 12:39 AM.   History   Chief Complaint Chief Complaint  Patient presents with  . Shortness of Breath    HPI: LEVEL 5 CAVEAT DUE TO ACUITY OF CONDITION HPI Comments: Annamary RummageRonald Steven Phoenix is a 50 y.o. Male, with a PMHx of AIDS, MI, Stroke, brought in by ambulance, who presents to the Emergency Department for shortness of breath, onset tonight. Per EMS, [facility] breath and coughing up frothy sputum. Concern for aspiration.  Pt opens eyes when his name is called. Pt with NRB in place.  Reported DO NOT RESUSCITATE but no paperwork provided.  Chart review reveals recent hospitalization and new diagnosis of end-stage AIDS. Collateral information provided by family. Patient did sign a DO NOT RESUSCITATE but they are unsure of the paperwork. They are in agreement that the patient would not want aggressive measures. They request antibiotics and fluids but no further escalation in care and they requested the patient stay comfortable. Father reports that the patient has not talked in 10 days. He has not eaten in approximately 5 days.   The history is provided by the EMS personnel. No language interpreter was used.  Shortness of Breath   Past Medical History:  Diagnosis Date  . HIV disease (HCC)   . MI (myocardial infarction) (HCC)   . Stroke [facility](HCC)     Patient Active Problem List   Diagnosis Date Noted  . Intractable hiccups 05/24/2016  . Dehydration 05/22/2016  . Hypokalemia 05/22/2016  . AIDS (HCC) 05/21/2016  . Personal history of stroke with current residual effects 05/21/2016  . Candida esophagitis (HCC) 05/19/2016  . HIV disease (HCC)  05/19/2016  . CVA (cerebral infarction) 05/19/2016  . 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 BRAIN SURGERY    . CARDIAC CATHETERIZATION    . cardiac stents       Home Medications    Prior to Admission medications   Medication Sig Start Date End Date Taking? Authorizing Provider  aspirin 81 MG tablet Take 81 mg by mouth every morning.    Historical Provider, MD  atorvastatin (LIPITOR) 80 MG tablet Take 40 mg by mouth every morning. 05/12/16   Historical Provider, MD  chlorproMAZINE (THORAZINE) 10 MG tablet Take 1 tablet (10 mg total) by mouth 3 (three) times daily as needed for hiccoughs. 05/17/16   Margarita Grizzleanielle Ray, MD  clopidogrel (PLAVIX) 75 MG tablet Take 75 mg by mouth every morning. 05/12/16   Historical Provider, MD  diphenhydramine-acetaminophen (TYLENOL PM) 25-500 MG TABS tablet Take 2 tablets by mouth at bedtime as needed (for sleep).    Historical Provider, MD  dolutegravir (TIVICAY) 50 MG tablet Take 1 tablet (50 mg total) by mouth daily. 05/23/16   Kari BaarsEdward Hawkins, MD  emtricitabine-tenofovir AF (DESCOVY) 200-25 MG tablet Take 1 tablet by mouth daily. 05/23/16   Kari BaarsEdward Hawkins, MD  fluconazole (DIFLUCAN) 200 MG tablet Take 2 tablets (400 mg total) by mouth daily. 05/23/16 06/13/16  Kari BaarsEdward Hawkins, MD  Potassium Chloride ER 20 MEQ TBCR Take 20 mEq by mouth every morning. 05/14/16   Historical Provider, MD  promethazine (PHENERGAN) 12.5 MG tablet Take 1 tablet (12.5 mg total) by mouth every 6 (six) hours as needed for nausea (Or for hiccups.). 05/23/16   Kari BaarsEdward Hawkins, MD  sulfamethoxazole-trimethoprim (BACTRIM DS,SEPTRA DS) 800-160 MG tablet Take 1 tablet by mouth daily. 05/23/16   Kari BaarsEdward Hawkins, MD   Family History History reviewed. No pertinent family history.  Social History Social History  Substance Use Topics  . Smoking status: Never Smoker  . Smokeless tobacco: Never Used  . Alcohol use No    Allergies   Review of patient's allergies indicates no known allergies.   Review of Systems Review of Systems  Unable to perform ROS: Acuity of condition   Physical Exam Updated Vital Signs BP 126/95   Pulse (!) 126   Temp 99.4 F (37.4 C) (Rectal)   Resp 22   SpO2 100%   Physical Exam  Constitutional: He appears distressed.  Chronically ill-appearing, cachectic, tachypneic  HENT:  Head: Normocephalic and atraumatic.  Mucous membranes dry  Eyes: Pupils are equal, round, and reactive to light.  Cardiovascular: Regular rhythm and normal heart sounds.   No murmur heard. Tachycardia  Pulmonary/Chest: Breath sounds normal. He is in respiratory distress. He has no wheezes.  Tachypnea, increased work of breathing, which is bilaterally with frothy sputum noted  Abdominal: Soft. Bowel sounds are normal. There is no tenderness. There is no rebound.  Musculoskeletal: He exhibits no edema.  Lymphadenopathy:    He has no cervical adenopathy.  Neurological:  Arouses to name, does not follow commands  Skin: Skin is warm. No rash noted. He is diaphoretic.  Psychiatric: He has a normal mood and affect.  Nursing note and vitals reviewed.    ED Treatments / Results  DIAGNOSTIC STUDIES: Oxygen Saturation is 92% on RA, low by my interpretation.  COORDINATION OF CARE: 12:37 AM-Will order fluids. Discussed treatment plan with pt at bedside and pt agreed to plan.   Labs (all labs ordered are listed, but only abnormal results are displayed) Labs Reviewed  COMPREHENSIVE METABOLIC PANEL - Abnormal; Notable for the following:       Result Value   Sodium 131 (*)    Potassium 6.7 (*)    CO2 14 (*)    Glucose, Bld 166 (*)    Creatinine, Ser 3.29 (*)    Calcium 11.7 (*)    Total Protein 10.4 (*)    GFR calc non Af Amer 20 (*)    GFR calc Af Amer 24 (*)    All other components within normal limits  CBC WITH DIFFERENTIAL/PLATELET - Abnormal; Notable for the following:    WBC 10.6  (*)    Neutro Abs 8.9 (*)    All other components within normal limits  I-STAT CG4 LACTIC ACID, ED - Abnormal; Notable for the following:    Lactic Acid, Venous 5.12 (*)    All other components within normal limits  CULTURE, BLOOD (ROUTINE X 2)  CULTURE, BLOOD (ROUTINE X 2)  URINE CULTURE  URINALYSIS, ROUTINE W REFLEX MICROSCOPIC (NOT [facility])    EKG  EKG Interpretation  Date/Time:  Tuesday June 09 2016 00:21:39 EDT Ventricular Rate:  144 PR Interval:    QRS Duration: 99 QT Interval:  310 QTC Calculation: 480 R Axis:   54 Text Interpretation:  Sinus tachycardia Nonspecific T abnormalities, lateral leads Borderline prolonged QT interval Confirmed by Wilkie AyeHORTON  MD, COURTNEY ([id number]) on 06/09/2016 1:11:56 AM      Radiology Dg Chest Port 1 View  Result Date:  06/09/2016 CLINICAL DATA:  Acute onset of shortness of breath and productive cough. Altered level of consciousness. Initial encounter. EXAM: PORTABLE CHEST 1 VIEW COMPARISON:  Chest radiograph performed 05/17/2016 FINDINGS: The lungs are well-aerated and clear. There is no evidence of focal opacification, pleural effusion or pneumothorax. The cardiomediastinal silhouette is within normal limits. No acute osseous abnormalities are seen. There is mild chronic deformity at the right lateral sixth rib. IMPRESSION: No acute cardiopulmonary process seen. Electronically Signed   By: Roanna Raider M.D.   On: 06/09/2016 00:57    Procedures Procedures (including critical care time)  CRITICAL CARE Performed by: Shon Baton   Total critical care time: 45 minutes  Critical care time was exclusive of separately billable procedures and treating other patients.  Critical care was necessary to treat or prevent imminent or life-threatening deterioration.  Critical care was time spent personally by me on the following activities: development of treatment plan with patient and/or surrogate as well as nursing, discussions with  consultants, evaluation of patient's response to treatment, examination of patient, obtaining history from patient or surrogate, ordering and performing treatments and interventions, ordering and review of laboratory studies, ordering and review of radiographic studies, pulse oximetry and re-evaluation of patient's condition.   Medications Ordered in ED Medications  sodium chloride 0.9 % bolus 1,000 mL (0 mLs Intravenous Stopped 06/09/16 0122)    And  sodium chloride 0.9 % bolus 1,000 mL (1,000 mLs Intravenous New Bag/Given 06/09/16 0045)    And  sodium chloride 0.9 % bolus 250 mL (250 mLs Intravenous New Bag/Given 06/09/16 0123)  vancomycin (VANCOCIN) IVPB 1000 mg/200 mL premix (1,000 mg Intravenous New Bag/Given 06/09/16 0045)  insulin aspart (novoLOG) injection 10 Units (not administered)  dextrose 50 % solution 50 mL (not administered)  sodium bicarbonate injection 50 mEq (not administered)  ceFEPIme (MAXIPIME) 2 g in dextrose 5 % 50 mL IVPB (2 g Intravenous New Bag/Given 06/09/16 0045)     Initial Impression / Assessment and Plan / ED Course  I have reviewed the triage vital signs and the nursing notes.  Pertinent labs & imaging results that were available during my care of the patient were reviewed by me and considered in my medical decision making (see chart for details).  Clinical Course   Patient presents in respiratory distress. End-stage HIV with recent diagnosis. It appears that he has had recent regressive PET failure to thrive. DO NOT RESUSCITATE per the family. They're requesting fluids and antibiotics but no further escalation. He appears to be in mild respiratory distress, is tachycardic, and hypotensive. Sepsis alert initiated. He was given 30 mL/kg of fluid and broad-spectrum antibiotics. Lactate greater than 5.  The kidney failure with a creatinine of 3.29.  Potassium 6.7.  Patient was given insulin and glucose as well as 1 amp of bicarbonate.    Patient's blood pressure  improved with fluid administration. He is a patient of Dr. Juanetta Gosling. I discussed patient's prognosis with the family and that he was critically ill. They understand.  Will admit to the hospital. Patient would likely benefit from palliative or hospice services. Patient's family states that he was being evaluated for this at the [facility].  Final Clinical Impressions(s) / ED Diagnoses   Final diagnoses:  Severe sepsis (HCC)  AIDS (acquired immune deficiency syndrome) (HCC)  Renal failure  Hyperkalemia    New Prescriptions New Prescriptions   No medications on file   I personally performed the services described in this documentation, which was scribed  in my presence. The recorded information has been reviewed and is accurate.     Shon Baton, MD 06/09/16 [phone]

## 2016-06-09 NOTE — Consult Note (Signed)
Consultation Note Date: 06/09/2016   Patient Name: Joseph Walton  DOB: [birth year]  MRN: [id number]  Age / Sex: 50 y.o., male  PCP: Kari Baars, MD Referring Physician: Kari Baars, MD  Reason for Consultation: Disposition, Establishing goals of care, Hospice Evaluation and Psychosocial/spiritual support  HPI/Patient Profile: 50 y.o. male  with past medical history of MI, stroke, new diagnosis of HIV/AIDS admitted on 06/09/2016 with stroke, failure to thrive, possible aspiration pneumonia.   Clinical Assessment and Goals of Care: Father tells the story of debilitation and decline. He is tearful at times. Father states he wants Brett Canales to be "comfortable". They share that they have already said goodbye. They state that they would like for him to be transferred to hospice home of [facility] 8/16 if Dr. Juanetta Gosling is in agreement. They share their respect for their longtime physician Dr. Juanetta Gosling.  Healthcare power of attorney HCPOA - mother Jonnie Truxillo   SUMMARY OF RECOMMENDATIONS   family states that they are wanting to focus on comfort care, we discussed the untethering from IV fluids and IV antibiotics, they are open to the hospice home and [place] on 8/16 with Dr. Juanetta Gosling blessing.  Code Status/Advance Care Planning:  DNR - we discussed the concept of allowing natural death  Symptom Management:   per Dr. Juanetta Gosling  Palliative Prophylaxis:   Aspiration, Frequent Pain Assessment, Oral Care and Turn Reposition  Additional Recommendations (Limitations, Scope, Preferences):  Considering hospice home 8/16  Psycho-social/Spiritual:   Desire for further Chaplaincy support:no  Additional Recommendations: Caregiving  Support/Resources, Education on Hospice and Grief/Bereavement Support  Prognosis:   < 2 weeks, likely based on chronic illness burden related to stroke, debility,  failure to thrive.  Discharge Planning: Families up in the [facility] hospice home 8/16 if Dr. Juanetta Gosling is in agreement      Primary Diagnoses: Present on Admission: . Severe sepsis (HCC)   I have reviewed the medical record, interviewed the patient and family, and examined the patient. The following aspects are pertinent.  Past Medical History:  Diagnosis Date  . HIV disease (HCC)   . MI (myocardial infarction) (HCC)   . Stroke [facility])    Social History   Social History  . Marital status: Single    Spouse name: N/A  . Number of children: N/A  . Years of education: N/A   Social History Main Topics  . Smoking status: Never Smoker  . Smokeless tobacco: Never Used  . Alcohol use No  . Drug use: No  . Sexual activity: Not Asked   Other Topics Concern  . None   Social History Narrative  . None   Family History  Problem Relation Age of Onset  . Family history unknown: Yes   Scheduled Meds: . antiseptic oral rinse  7 mL Mouth Rinse BID  . ceFEPime (MAXIPIME) IV  1 g Intravenous Q24H  . Chlorhexidine Gluconate Cloth  6 each Topical Q0600  . [START ON 06/10/2016] fluconazole (DIFLUCAN) IV  200 mg Intravenous Q24H  . heparin  5,000 Units Subcutaneous Q8H  . mupirocin ointment  1 application Nasal BID  . sodium chloride flush  3 mL Intravenous Q12H  . vancomycin  1,000 mg Intravenous Q24H   Continuous Infusions: . sodium chloride 150 mL/hr at 06/09/16 0352   PRN Meds:.chlorproMAZINE (THORAZINE) IV, metoCLOPramide (REGLAN) injection Medications Prior to Admission:  Prior to Admission medications   Medication Sig Start Date End Date Taking? Authorizing Provider  aspirin 81 MG tablet Take 81 mg by mouth every morning.    Historical Provider, MD  atorvastatin (LIPITOR) 80 MG tablet Take 40 mg by mouth every morning. 05/12/16   Historical Provider, MD  chlorproMAZINE (THORAZINE) 10 MG tablet Take 1 tablet (10 mg total) by mouth 3 (three) times daily as needed for  hiccoughs. 05/17/16   Margarita Grizzleanielle Ray, MD  clopidogrel (PLAVIX) 75 MG tablet Take 75 mg by mouth every morning. 05/12/16   Historical Provider, MD  diphenhydramine-acetaminophen (TYLENOL PM) 25-500 MG TABS tablet Take 2 tablets by mouth at bedtime as needed (for sleep).    Historical Provider, MD  dolutegravir (TIVICAY) 50 MG tablet Take 1 tablet (50 mg total) by mouth daily. 05/23/16   Kari BaarsEdward Hawkins, MD  emtricitabine-tenofovir AF (DESCOVY) 200-25 MG tablet Take 1 tablet by mouth daily. 05/23/16   Kari BaarsEdward Hawkins, MD  fluconazole (DIFLUCAN) 200 MG tablet Take 2 tablets (400 mg total) by mouth daily. 05/23/16 06/13/16  Kari BaarsEdward Hawkins, MD  Potassium Chloride ER 20 MEQ TBCR Take 20 mEq by mouth every morning. 05/14/16   Historical Provider, MD  promethazine (PHENERGAN) 12.5 MG tablet Take 1 tablet (12.5 mg total) by mouth every 6 (six) hours as needed for nausea (Or for hiccups.). 05/23/16   Kari BaarsEdward Hawkins, MD  sulfamethoxazole-trimethoprim (BACTRIM DS,SEPTRA DS) 800-160 MG tablet Take 1 tablet by mouth daily. 05/23/16   Kari BaarsEdward Hawkins, MD   No Known Allergies Review of Systems  Unable to perform ROS: Acuity of condition    Physical Exam  Constitutional: No distress.  Chronically ill appearing, muscle wasting temporal, no meaningful communication  HENT:  Head: Normocephalic and atraumatic.  Temporal wasting  Cardiovascular: Regular rhythm.   Rate the one tens 2 126  Pulmonary/Chest: Effort normal. No respiratory distress.  Rhonchi noted  Abdominal: Soft. He exhibits no distension.  Neurological:  No response to sternal rub, later during the visit he does open his eyes spontaneously  Skin: Skin is warm and dry.  Nursing note and vitals reviewed.   Vital Signs: BP 121/83   Pulse (!) 131   Temp 98.9 F (37.2 C)   Resp (!) 26   Ht 5\' 10"  (1.778 m)   Wt 65.7 kg (144 lb 13.5 oz)   SpO2 98%   BMI 20.78 kg/m  Pain Assessment: FLACC   Pain Score: 0-No pain   SpO2: SpO2: 98 % O2 Device:SpO2:  98 % O2 Flow Rate: .O2 Flow Rate (L/min): 15 L/min  IO: Intake/output summary:  Intake/Output Summary (Last 24 hours) at 06/09/16 1325 Last data filed at 06/09/16 1044  Gross per 24 hour  Intake           2785.5 ml  Output              866 ml  Net           1919.5 ml    LBM: Last BM Date: 06/09/16 Baseline Weight: Weight: 65.7 kg (144 lb 13.5 oz) Most recent weight: Weight: 65.7 kg (144 lb 13.5 oz)     Palliative Assessment/Data:  Flowsheet Rows   Flowsheet Row Most Recent Value  Intake Tab  Referral Department  Hospitalist  Unit at Time of Referral  ICU  Palliative Care Primary Diagnosis  Neurology  Date Notified  06/09/16  Palliative Care Type  New Palliative care  Reason for referral  Clarify Goals of Care  Date of Admission  06/09/16  Date first seen by Palliative Care  06/09/16  # of days Palliative referral response time  0 Day(s)  # of days IP prior to Palliative referral  0  Clinical Assessment  Palliative Performance Scale Score  10%  Pain Max last 24 hours  Not able to report  Pain Min Last 24 hours  Not able to report  Dyspnea Max Last 24 Hours  Not able to report  Dyspnea Min Last 24 hours  Not able to report  Psychosocial & Spiritual Assessment  Palliative Care Outcomes  Patient/Family meeting held?  Yes  Who was at the meeting?  Parents Rayfield CitizenCaroline and [facility]  Palliative Care Outcomes  Provided psychosocial or spiritual support, Provided end of life care assistance, Counseled regarding hospice  Patient/Family wishes: Interventions discontinued/not started   Mechanical Ventilation, Vasopressors, PEG  Palliative Care follow-up planned  -- [Follow-up while at [facility]]      Time In: 1500 Time Out: 1530 Time Total: 30 minutes Greater than 50%  of this time was spent counseling and coordinating care related to the above assessment and plan.  Signed by: Katheran Aweove,Alliana Mcauliff A, NP   Please contact Palliative Medicine Team phone at [phone] for questions and concerns.    For individual provider: See Loretha StaplerAmion

## 2016-06-09 NOTE — Clinical Social Work Note (Signed)
Clinical Social Work Assessment  Patient Details  Name: Joseph GranaRonald Steven Walton MRN: [id number] Date of Birth: 01-15-1966  Date of referral:  06/09/16               Reason for consult:  [facility] (Comment Required[facility]                Permission sought to share information with:    Permission granted to share information::     Name::        Agency::     Relationship::     Contact Information:     Housing/Transportation Living arrangements for the past 2 months:  Single Family Home, Assisted Living Facility, Skilled Nursing Facility Source of Information:  Facility Patient Interpreter Needed:  None Criminal Activity/Legal Involvement Pertinent to Current Situation/Hospitalization:  No - Comment as needed Significant Relationships:  Parents Lives with:  Facility Resident Do you feel safe going back to the place where you live?  Yes Need for family participation in patient care:  Yes (Comment)  Care giving concerns:  Facility resident   Office managerocial Worker assessment / plan: [facility] stated that patient has been at the facility a couple of weeks. She stated that he is not eating well and that staff assists with ADLs.  Patient has a supportive family, he can return to the facility and that no FL2 is needed. CSW spoke with Palliative care nurse and advised that patient may be Hospice appropriate and that she was going to speak with patient and his parents regarding goals of care.   Employment status:  Disabled (Comment on whether or not currently receiving Disability) Insurance information:  Managed Care PT Recommendations:    Information / Referral to community resources:     Patient/Family's Response to care:  Parents are unable to provide care for patient and he will return to the facility at discharge.  Patient/Family's Understanding of and Emotional Response to Diagnosis, Current Treatment, and Prognosis:  Parents will speak with palliative care regarding  diagnosis, treatment and prognosis.  Emotional Assessment Appearance:  Appears stated age Attitude/Demeanor/Rapport:  Unresponsive Affect (typically observed):  Unable to Assess Orientation:    Alcohol / Substance use:  Not Applicable Psych involvement (Current and /or in the community):  No (Comment)  Discharge Needs  Concerns to be addressed:  Decision making concerns Readmission within the last 30 days:  Yes Current discharge risk:  Chronically ill Barriers to Discharge:  No Barriers Identified   Annice NeedySettle, Mertice Uffelman D, LCSW 06/09/2016, 2:54 PM

## 2016-06-09 NOTE — Progress Notes (Signed)
CRITICAL VALUE ALERT  Critical value received:  Troponin 0.03 Date of notification:  06/09/16 Time of notification: 0545  Critical value read back:yes  Nurse who received alert: [email or web address]  MD notified (1st page):  Selena BattenKim Time of first page:  0545 MD notified (2nd page):  Time of second page:  Responding MD:  Selena BattenKim Time MD responded: )[phone]

## 2016-06-09 NOTE — Progress Notes (Signed)
Subjective: This is an assumption of care note on this 50 year old who was admitted early this morning. He has probably aspirated. He has significant difficulty with swallowing which has gotten worse. He's had a stroke he's had previous history of Candida esophagitis and recent diagnosis of AIDS. DO NOT RESUSCITATE status is appropriate. He appears to be septic.  Objective: Vital signs in last 24 hours: Temp:  [97.8 F (36.6 C)-99.5 F (37.5 C)] 99.5 F (37.5 C) (08/15 0719) Pulse Rate:  [117-148] 135 (08/15 0800) Resp:  [19-40] 22 (08/15 0800) BP: (82-133)/(69-95) 124/85 (08/15 0800) SpO2:  [92 %-100 %] 99 % (08/15 0800) Weight:  [65.7 kg (144 lb 13.5 oz)] 65.7 kg (144 lb 13.5 oz) (08/15 0315) Weight change:  Last BM Date: 06/09/16  Intake/Output from previous day: 08/14 0701 - 08/15 0700 In: 2782.5 [I.V.:2657.5; IV Piggyback:125] Out: 16 [Urine:16]  PHYSICAL EXAM General appearance: Poorly responsive Resp: rhonchi bilaterally Cardio: Regular with tachycardia GI: soft, non-tender; bowel sounds normal; no masses,  no organomegaly Extremities: extremities normal, atraumatic, no cyanosis or edema  Lab Results:  Results for orders placed or performed during the hospital encounter of 06/09/16 (from the past 48 hour(s))  Comprehensive metabolic panel     Status: Abnormal   Collection Time: 06/09/16 12:34 AM  Result Value Ref Range   Sodium 131 (L) 135 - 145 mmol/L   Potassium 6.7 (HH) 3.5 - 5.1 mmol/L    Comment: CRITICAL RESULT CALLED TO, READ BACK BY AND VERIFIED WITH: GIBSON,K AT 0120 BY HUFFINES,S ON 06/09/16.    Chloride 102 101 - 111 mmol/L   CO2 14 (L) 22 - 32 mmol/L   Glucose, Bld 166 (H) 65 - 99 mg/dL   BUN 92 (H) 6 - 20 mg/dL    Comment: RESULTS CONFIRMED BY MANUAL DILUTION   Creatinine, Ser 3.29 (H) 0.61 - 1.24 mg/dL   Calcium 11.7 (H) 8.9 - 10.3 mg/dL   Total Protein 10.4 (H) 6.5 - 8.1 g/dL   Albumin 3.8 3.5 - 5.0 g/dL   AST 38 15 - 41 U/L   ALT 33 17 - 63 U/L    Alkaline Phosphatase 119 38 - 126 U/L   Total Bilirubin 0.4 0.3 - 1.2 mg/dL   GFR calc non Af Amer 20 (L) >60 mL/min   GFR calc Af Amer 24 (L) >60 mL/min    Comment: (NOTE) The eGFR has been calculated using the CKD EPI equation. This calculation has not been validated in all clinical situations. eGFR's persistently <60 mL/min signify possible Chronic Kidney Disease.    Anion gap 15 5 - 15  CBC WITH DIFFERENTIAL     Status: Abnormal   Collection Time: 06/09/16 12:34 AM  Result Value Ref Range   WBC 10.6 (H) 4.0 - 10.5 K/uL   RBC 4.40 4.22 - 5.81 MIL/uL   Hemoglobin 14.1 13.0 - 17.0 g/dL   HCT 40.1 39.0 - 52.0 %   MCV 91.1 78.0 - 100.0 fL   MCH 32.0 26.0 - 34.0 pg   MCHC 35.2 30.0 - 36.0 g/dL   RDW 13.7 11.5 - 15.5 %   Platelets 276 150 - 400 K/uL   Neutrophils Relative % 85 %   Neutro Abs 8.9 (H) 1.7 - 7.7 K/uL   Lymphocytes Relative 10 %   Lymphs Abs 1.0 0.7 - 4.0 K/uL   Monocytes Relative 5 %   Monocytes Absolute 0.6 0.1 - 1.0 K/uL   Eosinophils Relative 0 %   Eosinophils Absolute 0.0 0.0 -  0.7 K/uL   Basophils Relative 0 %   Basophils Absolute 0.0 0.0 - 0.1 K/uL  I-Stat CG4 Lactic Acid, ED  (not at  [facility])     Status: Abnormal   Collection Time: 06/09/16 12:54 AM  Result Value Ref Range   Lactic Acid, Venous 5.12 (HH) 0.5 - 1.9 mmol/L   Comment NOTIFIED PHYSICIAN   Urinalysis, Routine w reflex microscopic (not at [facility])     Status: Abnormal   Collection Time: 06/09/16  1:10 AM  Result Value Ref Range   Color, Urine YELLOW YELLOW   APPearance CLEAR CLEAR   Specific Gravity, Urine >1.030 (H) 1.005 - 1.030   pH 5.0 5.0 - 8.0   Glucose, UA NEGATIVE NEGATIVE mg/dL   Hgb urine dipstick NEGATIVE NEGATIVE   Bilirubin Urine SMALL (A) NEGATIVE   Ketones, ur NEGATIVE NEGATIVE mg/dL   Protein, ur NEGATIVE NEGATIVE mg/dL   Nitrite NEGATIVE NEGATIVE   Leukocytes, UA NEGATIVE NEGATIVE    Comment: MICROSCOPIC NOT DONE ON URINES WITH NEGATIVE PROTEIN, BLOOD, LEUKOCYTES,  NITRITE, OR GLUCOSE <1000 mg/dL.  MRSA PCR Screening     Status: Abnormal   Collection Time: 06/09/16  4:00 AM  Result Value Ref Range   MRSA by PCR POSITIVE (A) NEGATIVE    Comment:        The GeneXpert MRSA Assay (FDA approved for NASAL specimens only), is one component of a comprehensive MRSA colonization surveillance program. It is not intended to diagnose MRSA infection nor to guide or monitor treatment for MRSA infections. RESULT CALLED TO, READ BACK BY AND VERIFIED WITH: SMITH,J AT 0640 BY HUFFINES,S ON 06/09/16.   Comprehensive metabolic panel     Status: Abnormal   Collection Time: 06/09/16  4:40 AM  Result Value Ref Range   Sodium 138 135 - 145 mmol/L    Comment: DELTA CHECK NOTED   Potassium 5.1 3.5 - 5.1 mmol/L   Chloride 107 101 - 111 mmol/L   CO2 20 (L) 22 - 32 mmol/L   Glucose, Bld 117 (H) 65 - 99 mg/dL   BUN 88 (H) 6 - 20 mg/dL   Creatinine, Ser 2.78 (H) 0.61 - 1.24 mg/dL   Calcium 10.1 8.9 - 10.3 mg/dL   Total Protein 8.0 6.5 - 8.1 g/dL   Albumin 2.9 (L) 3.5 - 5.0 g/dL   AST 33 15 - 41 U/L   ALT 28 17 - 63 U/L   Alkaline Phosphatase 93 38 - 126 U/L   Total Bilirubin 0.4 0.3 - 1.2 mg/dL   GFR calc non Af Amer 25 (L) >60 mL/min   GFR calc Af Amer 29 (L) >60 mL/min    Comment: (NOTE) The eGFR has been calculated using the CKD EPI equation. This calculation has not been validated in all clinical situations. eGFR's persistently <60 mL/min signify possible Chronic Kidney Disease.    Anion gap 11 5 - 15  CBC WITH DIFFERENTIAL     Status: Abnormal   Collection Time: 06/09/16  4:40 AM  Result Value Ref Range   WBC 11.4 (H) 4.0 - 10.5 K/uL   RBC 4.01 (L) 4.22 - 5.81 MIL/uL   Hemoglobin 12.2 (L) 13.0 - 17.0 g/dL   HCT 36.8 (L) 39.0 - 52.0 %   MCV 91.8 78.0 - 100.0 fL   MCH 30.4 26.0 - 34.0 pg   MCHC 33.2 30.0 - 36.0 g/dL   RDW 13.7 11.5 - 15.5 %   Platelets 199 150 - 400 K/uL   Neutrophils Relative %  87 %   Neutro Abs 9.9 (H) 1.7 - 7.7 K/uL    Lymphocytes Relative 6 %   Lymphs Abs 0.7 0.7 - 4.0 K/uL   Monocytes Relative 7 %   Monocytes Absolute 0.8 0.1 - 1.0 K/uL   Eosinophils Relative 0 %   Eosinophils Absolute 0.0 0.0 - 0.7 K/uL   Basophils Relative 0 %   Basophils Absolute 0.0 0.0 - 0.1 K/uL  TSH     Status: None   Collection Time: 06/09/16  4:40 AM  Result Value Ref Range   TSH 0.511 0.350 - 4.500 uIU/mL  Troponin I     Status: Abnormal   Collection Time: 06/09/16  4:40 AM  Result Value Ref Range   Troponin I 0.03 (HH) <0.03 ng/mL    Comment: CRITICAL RESULT CALLED TO, READ BACK BY AND VERIFIED WITH: WAGONER,R AT 5:45AM ON 06/09/16 BY FESTERMAN,C   Lactic acid, plasma     Status: Abnormal   Collection Time: 06/09/16  4:40 AM  Result Value Ref Range   Lactic Acid, Venous 4.1 (HH) 0.5 - 1.9 mmol/L    Comment: CRITICAL RESULT CALLED TO, READ BACK BY AND VERIFIED WITH: SMITH,J AT 5:35AM ON 06/09/16 BY FESTERMAN,C   Lactic acid, plasma     Status: Abnormal   Collection Time: 06/09/16  7:20 AM  Result Value Ref Range   Lactic Acid, Venous 2.5 (HH) 0.5 - 1.9 mmol/L    Comment: CRITICAL RESULT CALLED TO, READ BACK BY AND VERIFIED WITH: DAVIS,K AT 7:45AM 06/09/16 BY FESTERMAN,C     ABGS No results for input(s): PHART, PO2ART, TCO2, HCO3 in the last 72 hours.  Invalid input(s): PCO2 CULTURES Recent Results (from the past 240 hour(s))  MRSA PCR Screening     Status: Abnormal   Collection Time: 06/09/16  4:00 AM  Result Value Ref Range Status   MRSA by PCR POSITIVE (A) NEGATIVE Final    Comment:        The GeneXpert MRSA Assay (FDA approved for NASAL specimens only), is one component of a comprehensive MRSA colonization surveillance program. It is not intended to diagnose MRSA infection nor to guide or monitor treatment for MRSA infections. RESULT CALLED TO, READ BACK BY AND VERIFIED WITH: SMITH,J AT 0640 BY HUFFINES,S ON 06/09/16.    Studies/Results: Dg Chest Port 1 View  Result Date:  06/09/2016 CLINICAL DATA:  Acute onset of shortness of breath and productive cough. Altered level of consciousness. Initial encounter. EXAM: PORTABLE CHEST 1 VIEW COMPARISON:  Chest radiograph performed 05/17/2016 FINDINGS: The lungs are well-aerated and clear. There is no evidence of focal opacification, pleural effusion or pneumothorax. The cardiomediastinal silhouette is within normal limits. No acute osseous abnormalities are seen. There is mild chronic deformity at the right lateral sixth rib. IMPRESSION: No acute cardiopulmonary process seen. Electronically Signed   By: Garald Balding M.D.   On: 06/09/2016 00:57    Medications:  Prior to Admission:  Prescriptions Prior to Admission  Medication Sig Dispense Refill Last Dose  . aspirin 81 MG tablet Take 81 mg by mouth every morning.   05/19/2016 at Unknown time  . atorvastatin (LIPITOR) 80 MG tablet Take 40 mg by mouth every morning.  11 05/19/2016 at Unknown time  . chlorproMAZINE (THORAZINE) 10 MG tablet Take 1 tablet (10 mg total) by mouth 3 (three) times daily as needed for hiccoughs. 30 tablet 0 05/19/2016 at Unknown time  . clopidogrel (PLAVIX) 75 MG tablet Take 75 mg by mouth every morning.  11 05/19/2016 at Unknown time  . diphenhydramine-acetaminophen (TYLENOL PM) 25-500 MG TABS tablet Take 2 tablets by mouth at bedtime as needed (for sleep).   Past Month at Unknown time  . dolutegravir (TIVICAY) 50 MG tablet Take 1 tablet (50 mg total) by mouth daily. 30 tablet    . emtricitabine-tenofovir AF (DESCOVY) 200-25 MG tablet Take 1 tablet by mouth daily. 30 tablet    . fluconazole (DIFLUCAN) 200 MG tablet Take 2 tablets (400 mg total) by mouth daily. 7 tablet 0   . Potassium Chloride ER 20 MEQ TBCR Take 20 mEq by mouth every morning.  0 05/19/2016 at Unknown time  . promethazine (PHENERGAN) 12.5 MG tablet Take 1 tablet (12.5 mg total) by mouth every 6 (six) hours as needed for nausea (Or for hiccups.). 30 tablet 0   .  sulfamethoxazole-trimethoprim (BACTRIM DS,SEPTRA DS) 800-160 MG tablet Take 1 tablet by mouth daily.      Scheduled: . antiseptic oral rinse  7 mL Mouth Rinse BID  . ceFEPime (MAXIPIME) IV  1 g Intravenous Q24H  . Chlorhexidine Gluconate Cloth  6 each Topical Q0600  . [START ON 06/10/2016] fluconazole (DIFLUCAN) IV  200 mg Intravenous Q24H  . heparin  5,000 Units Subcutaneous Q8H  . mupirocin ointment  1 application Nasal BID  . sodium chloride flush  3 mL Intravenous Q12H  . vancomycin  1,000 mg Intravenous Q24H   Continuous: . sodium chloride 150 mL/hr at 06/09/16 0352   FOY:DXAJOINOMVEHMC (THORAZINE) IV, metoCLOPramide (REGLAN) injection  Assesment:He was admitted with sepsis. This is presumably secondary to aspiration pneumonia. He is in acute renal failure I think because he has not been eating or drinking. He is appropriate for hospice care. He is still hyperkalemic. Active Problems:   Severe sepsis (HCC)   Acute renal failure (ARF) (HCC)   Hyperkalemia   Hypercalcemia    Plan: Comfort care per Dr. Julianne Rice discussion with family. Palliative care consultation.    LOS: 0 days   Laroy Mustard L 06/09/2016, 8:57 AM

## 2016-06-09 NOTE — Progress Notes (Signed)
eLink Physician-Brief Progress Note Patient Name: Joseph RummageRonald Keiana Tavella Rottinghaus DOB: 1966/03/25 MRN: [id number]   Date of Service  06/09/2016  HPI/Events of Note  Notified of need for f/u lactic acid for sepsis protocol.   eICU Interventions  Will order Lactic Acid level now and Q 3 hours X 2.      Intervention Category Minor Interventions: Clinical assessment - ordering diagnostic tests  Lenell AntuSommer,Malee Grays Eugene 06/09/2016, 4:37 AM

## 2016-06-09 NOTE — ED Triage Notes (Signed)
[facility] and coughing up frothy sputum; pt will open eyes when his name is called

## 2016-06-09 NOTE — Care Management Note (Signed)
Case Management Note  Patient Details  Name: Joseph GranaRonald Steven Clutter MRN: [id number] Date of Birth: 01-Apr-1966  Subjective/Objective:                  Pt [facility] for sepsis. [facility] Anticipate pt will return to SNF vs Hospice at DC. Pt is critically and chronically ill and palliative consult has been placed. CSW is aware and will make arrangements for placement at DC.   Action/Plan: No CM needs anticipated.   Expected Discharge Date:    08/08/2016              Expected Discharge Plan:  Skilled Nursing Facility  In-House Referral:  Clinical Social Work, Hospice / Palliative Care  Discharge planning Services  CM Consult  Post Acute Care Choice:    Choice offered to:     DME Arranged:    DME Agency:     HH Arranged:    HH Agency:     Status of Service:  Completed, signed off  If discussed at [employer] Meetings, dates discussed:    Additional Comments:  Malcolm MetroChildress, Cutberto Winfree Demske, RN 06/09/2016, 9:56 AM

## 2016-06-09 NOTE — H&P (Signed)
TRH H&P   Patient Demographics:    Joseph Walton, is a 50 y.o. male  MRN: [id number]   DOB - [birth year]  Admit Date - 06/09/2016  Outpatient Primary MD for the patient is Alonza Bogus, MD  Referring MD/NP/PA: Thayer Jew  Outpatient Specialists:   Patient coming from: [facility]  Chief Complaint  Patient presents with  . Shortness of Breath      HPI:    Joseph Walton  is a 50 y.o. male, w HIV, CVA who apparently has been at the [facility] and not eating or drinking for about 7-10 days.  There appears to have been some concern for aspiration and therefore patient was sent to ED per notes from the [facility].   In the ED, pt was noted to be lethargic and hypotensive, and possibly septic.  Pt was tachycardic and   Lactic acid=5.12.  After discuss w family pt has been made DNR by ED.  Family wants no pressors.  Family is not interested in further imaging ie CT brain or MRI brain at this time.  They requested hydration and antibiotic.  Family is aware of the poor prognosis of this patient and requests that he be comfortable.       Review of systems:    In addition to the HPI above,  Unable to obtain due to altered mental status    With Past History of the following :    Past Medical History:  Diagnosis Date  . HIV disease ([place])   . MI (myocardial infarction) ([place])   . Stroke [facility])       Past Surgical History:  Procedure Laterality Date  . BRAIN SURGERY    . CARDIAC CATHETERIZATION    . cardiac stents        Social History:     Social History  Substance Use Topics  . Smoking status: Never Smoker  . Smokeless tobacco: Never Used  . Alcohol use No     Lives - at [facility] currently   Mobility - unable     Family History :     Family History  Problem Relation Age of Onset  . Family history unknown: Yes   Unable to obtain from  patient   Home Medications:   Prior to Admission medications   Medication Sig Start Date End Date Taking? Authorizing Provider  aspirin 81 MG tablet Take 81 mg by mouth every morning.    Historical Provider, MD  atorvastatin (LIPITOR) 80 MG tablet Take 40 mg by mouth every morning. 05/12/16   Historical Provider, MD  chlorproMAZINE (THORAZINE) 10 MG tablet Take 1 tablet (10 mg total) by mouth 3 (three) times daily as needed for hiccoughs. 05/17/16   Pattricia Boss, MD  clopidogrel (PLAVIX) 75 MG tablet Take 75 mg by mouth every morning. 05/12/16   Historical Provider, MD  diphenhydramine-acetaminophen (TYLENOL PM) 25-500  MG TABS tablet Take 2 tablets by mouth at bedtime as needed (for sleep).    Historical Provider, MD  dolutegravir (TIVICAY) 50 MG tablet Take 1 tablet (50 mg total) by mouth daily. 05/23/16   Sinda Du, MD  emtricitabine-tenofovir AF (DESCOVY) 200-25 MG tablet Take 1 tablet by mouth daily. 05/23/16   Sinda Du, MD  fluconazole (DIFLUCAN) 200 MG tablet Take 2 tablets (400 mg total) by mouth daily. 05/23/16 06/13/16  Sinda Du, MD  Potassium Chloride ER 20 MEQ TBCR Take 20 mEq by mouth every morning. 05/14/16   Historical Provider, MD  promethazine (PHENERGAN) 12.5 MG tablet Take 1 tablet (12.5 mg total) by mouth every 6 (six) hours as needed for nausea (Or for hiccups.). 05/23/16   Sinda Du, MD  sulfamethoxazole-trimethoprim (BACTRIM DS,SEPTRA DS) 800-160 MG tablet Take 1 tablet by mouth daily. 05/23/16   Sinda Du, MD     Allergies:    No Known Allergies   Physical Exam:   Vitals  Blood pressure 117/76, pulse 117, temperature 99.4 F (37.4 C), temperature source Rectal, resp. rate 20, SpO2 100 %.   1. General lying in bed in NAD,    2. Pt is not responsive to voice commands  3. No F.N deficits, ALL C.Nerves Intact, Strength 5/5 all 4 extremities, Sensation intact all 4 extremities, Plantars down going.  4. Ears and Eyes appear Normal, Conjunctivae  clear, PERRLA. Moist Oral Mucosa.  5. Supple Neck, No JVD, No cervical lymphadenopathy appriciated, No Carotid Bruits.  6. Symmetrical Chest wall movement, Good air movement bilaterally, CTAB.  7. RRR, No Gallops, Rubs or Murmurs, No Parasternal Heave.  8. Positive Bowel Sounds, Abdomen Soft, No tenderness, No organomegaly appriciated,No rebound -guarding or rigidity.  9.  No Cyanosis,No Skin Rash or Bruise.  10. Good muscle tone,  joints appear normal , no effusions, Normal ROM.  11. No Palpable Lymph Nodes in Neck or Axillae     Data Review:    CBC  Recent Labs Lab 06/09/16 0034  WBC 10.6*  HGB 14.1  HCT 40.1  PLT 276  MCV 91.1  MCH 32.0  MCHC 35.2  RDW 13.7  LYMPHSABS 1.0  MONOABS 0.6  EOSABS 0.0  BASOSABS 0.0   ------------------------------------------------------------------------------------------------------------------  Chemistries   Recent Labs Lab 06/09/16 0034  NA 131*  K 6.7*  CL 102  CO2 14*  GLUCOSE 166*  BUN 92*  CREATININE 3.29*  CALCIUM 11.7*  AST 38  ALT 33  ALKPHOS 119  BILITOT 0.4   ------------------------------------------------------------------------------------------------------------------ CrCl cannot be calculated (Unknown ideal weight.). ------------------------------------------------------------------------------------------------------------------ No results for input(s): TSH, T4TOTAL, T3FREE, THYROIDAB in the last 72 hours.  Invalid input(s): FREET3  Coagulation profile No results for input(s): INR, PROTIME in the last 168 hours. ------------------------------------------------------------------------------------------------------------------- No results for input(s): DDIMER in the last 72 hours. -------------------------------------------------------------------------------------------------------------------  Cardiac Enzymes No results for input(s): CKMB, TROPONINI, MYOGLOBIN in the last 168 hours.  Invalid  input(s): CK ------------------------------------------------------------------------------------------------------------------ No results found for: BNP   ---------------------------------------------------------------------------------------------------------------  Urinalysis    Component Value Date/Time   COLORURINE YELLOW 06/09/2016 0110   APPEARANCEUR CLEAR 06/09/2016 0110   LABSPEC >1.030 (H) 06/09/2016 0110   PHURINE 5.0 06/09/2016 0110   GLUCOSEU NEGATIVE 06/09/2016 0110   HGBUR NEGATIVE 06/09/2016 0110   BILIRUBINUR SMALL (A) 06/09/2016 0110   KETONESUR NEGATIVE 06/09/2016 0110   PROTEINUR NEGATIVE 06/09/2016 0110   UROBILINOGEN 1.0 10/01/2007 1324   NITRITE NEGATIVE 06/09/2016 0110   LEUKOCYTESUR NEGATIVE 06/09/2016 0110    ----------------------------------------------------------------------------------------------------------------  Imaging Results:    Dg Chest Port 1 View  Result Date: 06/09/2016 CLINICAL DATA:  Acute onset of shortness of breath and productive cough. Altered level of consciousness. Initial encounter. EXAM: PORTABLE CHEST 1 VIEW COMPARISON:  Chest radiograph performed 05/17/2016 FINDINGS: The lungs are well-aerated and clear. There is no evidence of focal opacification, pleural effusion or pneumothorax. The cardiomediastinal silhouette is within normal limits. No acute osseous abnormalities are seen. There is mild chronic deformity at the right lateral sixth rib. IMPRESSION: No acute cardiopulmonary process seen. Electronically Signed   By: Garald Balding M.D.   On: 06/09/2016 00:57       Assessment & Plan:    Active Problems:   Severe sepsis (HCC)   Acute renal failure (ARF) (HCC)   Hyperkalemia   Hypercalcemia    1. ARF Likely due to dehydration Hydrate with ns iv Check cmp in am  2. AMS ? New CVA vs hypercalcemia Hydrate with ns iv Check cmp in am Consider lasix/ bisphosphonate if not improving, and further w/up with    Magnesium, phos, pth, pthrp, tsh, esr, spep, upep,   3.  ? Aspiration w sepsis Blood culture x2 pending vanco iv Zosyn iv  4. Tachycardia ? Sepsis vs dehydration See if resolves with hydration  DVT Prophylaxis Heparin  SCDs   AM Labs Ordered, also please review Full Orders  Family Communication: Admission, patients condition and plan of care including tests being ordered have been discussed with the family, mother and father who indicate understanding and agree with the plan and Code Status.  Code Status DNR, palliative care consult ordered  Likely DC to    Condition Critical  Consults called:   Admission status: inpatient  Time spent in minutes : 22 minutes   Jani Gravel M.D on 06/09/2016 at 2:15 AM  Between 7am to 7pm - Pager - [phone] After 7pm go to [email or web address] - password [facility]  [facility] - Office  [phone]

## 2016-06-09 NOTE — Progress Notes (Signed)
ANTIBIOTIC CONSULT NOTE-Preliminary  Pharmacy Consult for antifungal  Indication: esophageal candidiasis  No Known Allergies  Patient Measurements: Height: 5\' 10"  (177.8 cm) Weight: 144 lb 13.5 oz (65.7 kg) IBW/kg (Calculated) : 73 Adjusted Body Weight:   Vital Signs: Temp: 97.8 F (36.6 C) (08/15 0315) Temp Source: Axillary (08/15 0315) BP: 126/93 (08/15 0315) Pulse Rate: 120 (08/15 0315)  Labs:  Recent Labs  06/09/16 0034  WBC 10.6*  HGB 14.1  PLT 276  CREATININE 3.29*    Estimated Creatinine Clearance: 25 mL/min (by C-G formula based on SCr of 3.29 mg/dL).  No results for input(s): VANCOTROUGH, VANCOPEAK, VANCORANDOM, GENTTROUGH, GENTPEAK, GENTRANDOM, TOBRATROUGH, TOBRAPEAK, TOBRARND, AMIKACINPEAK, AMIKACINTROU, AMIKACIN in the last 72 hours.   Microbiology: No results found for this or any previous visit (from the past 720 hour(s)).  Medical History: Past Medical History:  Diagnosis Date  . HIV disease (HCC)   . MI (myocardial infarction) (HCC)   . Stroke [facility](HCC)     Medications:  Prescriptions Prior to Admission  Medication Sig Dispense Refill Last Dose  . aspirin 81 MG tablet Take 81 mg by mouth every morning.   05/19/2016 at Unknown time  . atorvastatin (LIPITOR) 80 MG tablet Take 40 mg by mouth every morning.  11 05/19/2016 at Unknown time  . chlorproMAZINE (THORAZINE) 10 MG tablet Take 1 tablet (10 mg total) by mouth 3 (three) times daily as needed for hiccoughs. 30 tablet 0 05/19/2016 at Unknown time  . clopidogrel (PLAVIX) 75 MG tablet Take 75 mg by mouth every morning.  11 05/19/2016 at Unknown time  . diphenhydramine-acetaminophen (TYLENOL PM) 25-500 MG TABS tablet Take 2 tablets by mouth at bedtime as needed (for sleep).   Past Month at Unknown time  . dolutegravir (TIVICAY) 50 MG tablet Take 1 tablet (50 mg total) by mouth daily. 30 tablet    . emtricitabine-tenofovir AF (DESCOVY) 200-25 MG tablet Take 1 tablet by mouth daily. 30 tablet    .  fluconazole (DIFLUCAN) 200 MG tablet Take 2 tablets (400 mg total) by mouth daily. 7 tablet 0   . Potassium Chloride ER 20 MEQ TBCR Take 20 mEq by mouth every morning.  0 05/19/2016 at Unknown time  . promethazine (PHENERGAN) 12.5 MG tablet Take 1 tablet (12.5 mg total) by mouth every 6 (six) hours as needed for nausea (Or for hiccups.). 30 tablet 0   . sulfamethoxazole-trimethoprim (BACTRIM DS,SEPTRA DS) 800-160 MG tablet Take 1 tablet by mouth daily.      Scheduled:  . antiseptic oral rinse  7 mL Mouth Rinse BID  . heparin  5,000 Units Subcutaneous Q8H  . micafungin (MYCAMINE) IV  150 mg Intravenous Once  . sodium chloride flush  3 mL Intravenous Q12H   Infusions:  . sodium chloride 150 mL/hr at 06/09/16 0352   PRN: chlorproMAZINE (THORAZINE) IV, metoCLOPramide (REGLAN) injection Anti-infectives    Start     Dose/Rate Route Frequency Ordered Stop   06/09/16 0415  micafungin (MYCAMINE) 150 mg in sodium chloride 0.9 % 100 mL IVPB     150 mg 100 mL/hr over 1 Hours Intravenous  Once 06/09/16 0403     06/09/16 0045  ceFEPIme (MAXIPIME) 2 g in dextrose 5 % 50 mL IVPB     2 g 100 mL/hr over 30 Minutes Intravenous  Once 06/09/16 0034 06/09/16 0148   06/09/16 0045  vancomycin (VANCOCIN) IVPB 1000 mg/200 mL premix     1,000 mg 200 mL/hr over 60 Minutes Intravenous  Once 06/09/16 0034  06/09/16 0206      Assessment: 50 yo male with HIV, CVA that has not been eating or drinking for 7-10 days. Pt in ARF, receiving hydration. May consider changing to micafungin if continued.   Goal of Therapy:  eradication of infection  Plan:  Preliminary review of pertinent patient information completed.  Protocol will be initiated with a one-time dose(s) of fluconazole 200mg  IV x 1.  Jeani HawkingAnnie Penn clinical pharmacist will complete review during morning rounds to assess patient and finalize treatment regimen.  Loyola MastMidkiff, Jayce Kainz Scarlett, Fitzgibbon HospitalRPH 06/09/2016,4:06 AM

## 2016-06-10 LAB — BASIC METABOLIC PANEL
Anion gap: 6 (ref 5–15)
BUN: 45 mg/dL — AB (ref 6–20)
CO2: 20 mmol/L — ABNORMAL LOW (ref 22–32)
CREATININE: 1.35 mg/dL — AB (ref 0.61–1.24)
Calcium: 9.5 mg/dL (ref 8.9–10.3)
Chloride: 111 mmol/L (ref 101–111)
GFR, EST NON AFRICAN AMERICAN: 60 mL/min — AB (ref >60)
Glucose, Bld: 89 mg/dL (ref 65–99)
POTASSIUM: 4.5 mmol/L (ref 3.5–5.1)
SODIUM: 137 mmol/L (ref 135–145)

## 2016-06-10 LAB — URINE CULTURE: CULTURE: NO GROWTH

## 2016-06-10 NOTE — Care Management Note (Signed)
Case Management Note  Patient Details  Name: Annamary RummageRonald Steven Slagter MRN: [id number] Date of Birth: [birth year]   Expected Discharge Date:   06/10/2016               Expected Discharge Plan:  Hospice Medical Facility  In-House Referral:  Clinical Social Work, Hospice / Palliative Care  Discharge planning Services  CM Consult  Post Acute Care Choice:    Choice offered to:     DME Arranged:    DME Agency:     HH Arranged:    HH Agency:     Status of Service:  Completed, signed off  If discussed at [employer] Meetings, dates discussed:    Additional Comments: Pt discharging to hospice of [facility] today. CSW has made arrangements for placement. Pt's family agreeable to DC plan. No CM needs.   Malcolm Metrohildress, Loyalty Brashier Demske, RN 06/10/2016, 9:05 AM

## 2016-06-10 NOTE — Discharge Summary (Signed)
Physician Discharge Summary  Patient ID: Joseph RummageRonald Steven Mercer MRN: [id number] DOB/AGE: Oct 22, 1966 50 y.o. Primary Care Physician:Gerline Ratto L, MD Admit date: 06/09/2016 Discharge date: 06/10/2016    Discharge Diagnoses:   Active Problems:   Severe sepsis (HCC)   Acute renal failure (ARF) (HCC)   Hyperkalemia   Hypercalcemia   Palliative care encounter   Goals of care, counseling/discussion   DNR (do not resuscitate) discussion AIDS AIDS related dementia Personal history of stroke with current residual effects Dehydration Candida esophagitis Aspiration pneumonia    Medication List    STOP taking these medications   aspirin 81 MG tablet   atorvastatin 80 MG tablet Commonly known as:  LIPITOR   chlorproMAZINE 10 MG tablet Commonly known as:  THORAZINE   clopidogrel 75 MG tablet Commonly known as:  PLAVIX   diphenhydramine-acetaminophen 25-500 MG Tabs tablet Commonly known as:  TYLENOL PM   dolutegravir 50 MG tablet Commonly known as:  TIVICAY   emtricitabine-tenofovir AF 200-25 MG tablet Commonly known as:  DESCOVY   fluconazole 200 MG tablet Commonly known as:  DIFLUCAN   Potassium Chloride ER 20 MEQ Tbcr   promethazine 12.5 MG tablet Commonly known as:  PHENERGAN   sulfamethoxazole-trimethoprim 800-160 MG tablet Commonly known as:  BACTRIM DS,SEPTRA DS       Discharged Condition:Unchanged    Consults: Palliative care  Significant Diagnostic Studies: Dg Chest 2 View  Result Date: 05/17/2016 CLINICAL DATA:  Hiccups for over 12 hours. Status post fall this morning. EXAM: CHEST  2 VIEW COMPARISON:  PA and lateral chest 08/30/2009. FINDINGS: Lungs are clear. Heart size is normal. No pneumothorax or pleural effusion. No bony abnormality. IMPRESSION: Negative chest. Electronically Signed   By: Drusilla Kannerhomas  Dalessio M.D.   On: 05/17/2016 17:23  Ct Head Wo Contrast  Result Date: 05/12/2016 CLINICAL DATA:  Development of slurred speech over the last  several months. Possible stroke. EXAM: CT HEAD WITHOUT CONTRAST TECHNIQUE: Contiguous axial images were obtained from the base of the skull through the vertex without intravenous contrast. COMPARISON:  None. FINDINGS: There is a late subacute to old cortical and subcortical infarction in the right posterior frontal cortical and subcortical brain. There is probably an infarction in the right lateral thalamus/ posterior limb internal capsule which could be old or subacute. No sign of mass lesion, hemorrhage, hydrocephalus or extra-axial collection. The calvarium is unremarkable. Sinuses, middle ears and mastoids are clear. IMPRESSION: Late subacute or old cortical and subcortical infarction in the right posterior frontal lobe. Probable similar age infarction at the right lateral thalamus/posterior limb internal capsule. No sign of hemorrhage or mass effect. Electronically Signed   By: Paulina FusiMark  Shogry M.D.   On: 05/12/2016 07:24   [place] Carotid Bilateral  Result Date: 05/22/2016 CLINICAL DATA:  Stroke, vertigo and history of coronary artery disease. EXAM: BILATERAL CAROTID DUPLEX ULTRASOUND TECHNIQUE: Wallace CullensGray scale imaging, color Doppler and duplex ultrasound were performed of bilateral carotid and vertebral arteries in the neck. COMPARISON:  None. FINDINGS: Criteria: Quantification of carotid stenosis is based on velocity parameters that correlate the residual internal carotid diameter with NASCET-based stenosis levels, using the diameter of the distal internal carotid lumen as the denominator for stenosis measurement. The following velocity measurements were obtained: RIGHT ICA:  88/32 cm/sec CCA:  88/20 cm/sec SYSTOLIC ICA/CCA RATIO:  1.0 DIASTOLIC ICA/CCA RATIO:  1.8 ECA:  94 cm/sec LEFT ICA:  76/26 cm/sec CCA:  90/20 cm/sec SYSTOLIC ICA/CCA RATIO:  0.8 DIASTOLIC ICA/CCA RATIO:  1.3 ECA:  85 cm/sec RIGHT  CAROTID ARTERY: There is a mild amount of partially calcified plaque at the level of the distal common carotid artery  and carotid bulb extending into the proximal ICA. Velocities and waveforms are normal and estimated right ICA stenosis is less than 50%. RIGHT VERTEBRAL ARTERY: Antegrade flow with normal waveform and velocity. LEFT CAROTID ARTERY: There is a minimal amount of noncalcified plaque at the level of the distal carotid bulb and proximal ICA. Velocities and waveforms are unremarkable and estimated left ICA stenosis is less than 50%. LEFT VERTEBRAL ARTERY: Antegrade flow with normal waveform and velocity. IMPRESSION: Mild amount of plaque at the level of both carotid bulbs and proximal internal carotid arteries, right greater than left. No significant carotid stenosis identified with estimated bilateral ICA stenoses of less than 50%. Electronically Signed   By: Irish LackGlenn  Yamagata M.D.   On: 05/22/2016 11:45  Dg Chest Port 1 View  Result Date: 06/09/2016 CLINICAL DATA:  Acute onset of shortness of breath and productive cough. Altered level of consciousness. Initial encounter. EXAM: PORTABLE CHEST 1 VIEW COMPARISON:  Chest radiograph performed 05/17/2016 FINDINGS: The lungs are well-aerated and clear. There is no evidence of focal opacification, pleural effusion or pneumothorax. The cardiomediastinal silhouette is within normal limits. No acute osseous abnormalities are seen. There is mild chronic deformity at the right lateral sixth rib. IMPRESSION: No acute cardiopulmonary process seen. Electronically Signed   By: Roanna RaiderJeffery  Chang M.D.   On: 06/09/2016 00:57    Lab Results: Basic Metabolic Panel:  Recent Labs  16/07/9607/15/17 0440 06/10/16 0423  NA 138 137  K 5.1 4.5  CL 107 111  CO2 20* 20*  GLUCOSE 117* 89  BUN 88* 45*  CREATININE 2.78* 1.35*  CALCIUM 10.1 9.5   Liver Function Tests:  Recent Labs  06/09/16 0034 06/09/16 0440  AST 38 33  ALT 33 28  ALKPHOS 119 93  BILITOT 0.4 0.4  PROT 10.4* 8.0  ALBUMIN 3.8 2.9*     CBC:  Recent Labs  06/09/16 0034 06/09/16 0440  WBC 10.6* 11.4*   NEUTROABS 8.9* 9.9*  HGB 14.1 12.2*  HCT 40.1 36.8*  MCV 91.1 91.8  PLT 276 199    Recent Results (from the past 240 hour(s))  MRSA PCR Screening     Status: Abnormal   Collection Time: 06/09/16  4:00 AM  Result Value Ref Range Status   MRSA by PCR POSITIVE (A) NEGATIVE Final    Comment:        The GeneXpert MRSA Assay (FDA approved for NASAL specimens only), is one component of a comprehensive MRSA colonization surveillance program. It is not intended to diagnose MRSA infection nor to guide or monitor treatment for MRSA infections. RESULT CALLED TO, READ BACK BY AND VERIFIED WITH: SMITH,J AT 0640 BY HUFFINES,S ON 06/09/16.      Hospital Course: This is a 50 year old who had a stroke approximately 2 months ago. He did not seek medical care at that time. He had slurred speech had some difficulty swallowing. He later developed increasing difficulty swallowing and was found to have Candida esophagitis was HIV positive with AIDS defining illness. He was admitted to the hospital about a month ago with this problem and was given IV fluids had PT OT etc. He was started on Diflucan. He was started on anti-retroviral therapy. He was transferred to a skilled care facility but has continued to deteriorate despite treatment. He came to the emergency department with what appears to be sepsis possibly from aspiration. He had  acute renal failure hypotension tachycardia and was poorly responsive. I discussed his situation again with his family. We have had ongoing discussions through the last 2-3 weeks about what is the best course of therapy for their son. I reviewed all the problems and they request comfort care. This has been arranged. He has a bed at the inpatient hospice facility and will be transferred there  Discharge Exam: Blood pressure 114/84, pulse (!) 116, temperature 99 F (37.2 C), temperature source Axillary, resp. rate (!) 27, height 5\' 10"  (1.778 m), weight 65.7 kg (144 lb 13.5 oz),  SpO2 93 %. Poorly responsive. Right hemiparesis. Slurred speech when he can talk.  Disposition: To hospice inpatient facility. I think his lifespan will be measured in days  Discharge Instructions    Discharge patient    Complete by:  As directed   To inpatient hospice facility when bed available        Signed: Janice Bodine L   06/10/2016, 8:51 AM

## 2016-06-10 NOTE — Progress Notes (Signed)
Subjective: He is essentially unresponsive. He has continued to have fever. Palliative care consult noted and appreciated. His family requests comfort care which I think is appropriate  Objective: Vital signs in last 24 hours: Temp:  [98.8 F (37.1 C)-101.1 F (38.4 C)] 98.8 F (37.1 C) (08/16 0400) Pulse Rate:  [114-144] 116 (08/16 0600) Resp:  [22-46] 27 (08/16 0600) BP: (110-147)/(73-91) 114/84 (08/16 0400) SpO2:  [85 %-99 %] 93 % (08/16 0600) Weight change:  Last BM Date: 06/09/16  Intake/Output from previous day: 08/15 0701 - 08/16 0700 In: 3790.5 [I.V.:3515.5; IV Piggyback:275] Out: 2300 [Urine:2300]  PHYSICAL EXAM General appearance: Poorly responsive right hemiparesis Resp: rhonchi bilaterally Cardio: regular rate and rhythm, S1, S2 normal, no murmur, click, rub or gallop GI: soft, non-tender; bowel sounds normal; no masses,  no organomegaly Extremities: extremities normal, atraumatic, no cyanosis or edema  Lab Results:  Results for orders placed or performed during the hospital encounter of 06/09/16 (from the past 48 hour(s))  Comprehensive metabolic panel     Status: Abnormal   Collection Time: 06/09/16 12:34 AM  Result Value Ref Range   Sodium 131 ([place]) 135 - 145 mmol/[place]   Potassium 6.7 (HH) 3.5 - 5.1 mmol/[place]    Comment: CRITICAL RESULT CALLED TO, READ BACK BY AND VERIFIED WITH: GIBSON,K AT 0120 BY HUFFINES,S ON 06/09/16.    Chloride 102 101 - 111 mmol/Walton   CO2 14 ([place]ton) 22 - 32 mmol/[place]   Glucose, Bld 166 (H) 65 - 99 mg/dL   BUN 92 (H) 6 - 20 mg/dL    Comment: RESULTS CONFIRMED BY MANUAL DILUTION   Creatinine, Ser 3.29 (H) 0.61 - 1.24 mg/dL   Calcium 11.7 (H) 8.9 - 10.3 mg/dL   Total Protein 10.4 (H) 6.5 - 8.1 g/dL   Albumin 3.8 3.5 - 5.0 g/dL   AST 38 15 - 41 U/[place]ton   ALT 33 17 - 63 U/[place]   Alkaline Phosphatase 119 38 - 126 U/[place]   Total Bilirubin 0.4 0.3 - 1.2 mg/dL   GFR calc non Af Amer 20 ([place]) >60 mL/min   GFR calc Af Amer 24 ([place]) >60 mL/min    Comment: (NOTE) The  eGFR has been calculated using the CKD EPI equation. This calculation has not been validated in all clinical situations. eGFR's persistently <60 mL/min signify possible Chronic Kidney Disease.    Anion gap 15 5 - 15  CBC WITH DIFFERENTIAL     Status: Abnormal   Collection Time: 06/09/16 12:34 AM  Result Value Ref Range   WBC 10.6 (H) 4.0 - 10.5 K/uL   RBC 4.40 4.22 - 5.81 MIL/uL   Hemoglobin 14.1 13.0 - 17.0 g/dL   HCT 40.1 39.0 - 52.0 %   MCV 91.1 78.0 - 100.0 fL   MCH 32.0 26.0 - 34.0 pg   MCHC 35.2 30.0 - 36.0 g/dL   RDW 13.7 11.5 - 15.5 %   Platelets 276 150 - 400 K/uL   Neutrophils Relative % 85 %   Neutro Abs 8.9 (H) 1.7 - 7.7 K/uL   Lymphocytes Relative 10 %   Lymphs Abs 1.0 0.7 - 4.0 K/uL   Monocytes Relative 5 %   Monocytes Absolute 0.6 0.1 - 1.0 K/uL   Eosinophils Relative 0 %   Eosinophils Absolute 0.0 0.0 - 0.7 K/uL   Basophils Relative 0 %   Basophils Absolute 0.0 0.0 - 0.1 K/uL  I-Stat CG4 Lactic Acid, ED  (not at  [facility])     Status: Abnormal  Collection Time: 06/09/16 12:54 AM  Result Value Ref Range   Lactic Acid, Venous 5.12 (HH) 0.5 - 1.9 mmol/[place]   Comment NOTIFIED PHYSICIAN   Urinalysis, Routine w reflex microscopic (not at [facility])     Status: Abnormal   Collection Time: 06/09/16  1:10 AM  Result Value Ref Range   Color, Urine YELLOW YELLOW   APPearance CLEAR CLEAR   Specific Gravity, Urine >1.030 (H) 1.005 - 1.030   pH 5.0 5.0 - 8.0   Glucose, UA NEGATIVE NEGATIVE mg/dL   Hgb urine dipstick NEGATIVE NEGATIVE   Bilirubin Urine SMALL (A) NEGATIVE   Ketones, ur NEGATIVE NEGATIVE mg/dL   Protein, ur NEGATIVE NEGATIVE mg/dL   Nitrite NEGATIVE NEGATIVE   Leukocytes, UA NEGATIVE NEGATIVE    Comment: MICROSCOPIC NOT DONE ON URINES WITH NEGATIVE PROTEIN, BLOOD, LEUKOCYTES, NITRITE, OR GLUCOSE <1000 mg/dL.  MRSA PCR Screening     Status: Abnormal   Collection Time: 06/09/16  4:00 AM  Result Value Ref Range   MRSA by PCR POSITIVE (A) NEGATIVE    Comment:         The GeneXpert MRSA Assay (FDA approved for NASAL specimens only), is one component of a comprehensive MRSA colonization surveillance program. It is not intended to diagnose MRSA infection nor to guide or monitor treatment for MRSA infections. RESULT CALLED TO, READ BACK BY AND VERIFIED WITH: SMITH,J AT 0640 BY HUFFINES,S ON 06/09/16.   Comprehensive metabolic panel     Status: Abnormal   Collection Time: 06/09/16  4:40 AM  Result Value Ref Range   Sodium 138 135 - 145 mmol/[place]    Comment: DELTA CHECK NOTED   Potassium 5.1 3.5 - 5.1 mmol/[place]ton   Chloride 107 101 - 111 mmol/Walton   CO2 20 ([place]ton) 22 - 32 mmol/Wal[place]   Glucose, Bld 117 (H) 65 - 99 mg/dL   BUN 88 (H) 6 - 20 mg/dL   Creatinine, Ser 2.78 (H) 0.61 - 1.24 mg/dL   Calcium 10.1 8.9 - 10.3 mg/dL   Total Protein 8.0 6.5 - 8.1 g/dL   Albumin 2.9 ([place]) 3.5 - 5.0 g/dL   AST 33 15 - 41 U/[place]   ALT 28 17 - 63 U/[place]   Alkaline Phosphatase 93 38 - 126 U/[place]   Total Bilirubin 0.4 0.3 - 1.2 mg/dL   GFR calc non Af Amer 25 ([place]) >60 mL/min   GFR calc Af Amer 29 ([place]) >60 mL/min    Comment: (NOTE) The eGFR has been calculated using the CKD EPI equation. This calculation has not been validated in all clinical situations. eGFR's persistently <60 mL/min signify possible Chronic Kidney Disease.    Anion gap 11 5 - 15  CBC WITH DIFFERENTIAL     Status: Abnormal   Collection Time: 06/09/16  4:40 AM  Result Value Ref Range   WBC 11.4 (H) 4.0 - 10.5 K/uL   RBC 4.01 ([place]) 4.22 - 5.81 MIL/uL   Hemoglobin 12.2 ([place]) 13.0 - 17.0 g/dL   HCT 36.8 ([place]) 39.0 - 52.0 %   MCV 91.8 78.0 - 100.0 fL   MCH 30.4 26.0 - 34.0 pg   MCHC 33.2 30.0 - 36.0 g/dL   RDW 13.7 11.5 - 15.5 %   Platelets 199 150 - 400 K/uL   Neutrophils Relative % 87 %   Neutro Abs 9.9 (H) 1.7 - 7.7 K/uL   Lymphocytes Relative 6 %   Lymphs Abs 0.7 0.7 - 4.0 K/uL   Monocytes Relative 7 %  Monocytes Absolute 0.8 0.1 - 1.0 K/uL   Eosinophils Relative 0 %   Eosinophils Absolute 0.0 0.0 - 0.7 K/uL    Basophils Relative 0 %   Basophils Absolute 0.0 0.0 - 0.1 K/uL  TSH     Status: None   Collection Time: 06/09/16  4:40 AM  Result Value Ref Range   TSH 0.511 0.350 - 4.500 uIU/mL  Troponin I     Status: Abnormal   Collection Time: 06/09/16  4:40 AM  Result Value Ref Range   Troponin I 0.03 (HH) <0.03 ng/mL    Comment: CRITICAL RESULT CALLED TO, READ BACK BY AND VERIFIED WITH: WAGONER,R AT 5:45AM ON 06/09/16 BY FESTERMAN,C   Lactic acid, plasma     Status: Abnormal   Collection Time: 06/09/16  4:40 AM  Result Value Ref Range   Lactic Acid, Venous 4.1 (HH) 0.5 - 1.9 mmol/[place]    Comment: CRITICAL RESULT CALLED TO, READ BACK BY AND VERIFIED WITH: SMITH,J AT 5:35AM ON 06/09/16 BY FESTERMAN,C   Lactic acid, plasma     Status: Abnormal   Collection Time: 06/09/16  7:20 AM  Result Value Ref Range   Lactic Acid, Venous 2.5 (HH) 0.5 - 1.9 mmol/[place]    Comment: CRITICAL RESULT CALLED TO, READ BACK BY AND VERIFIED WITH: DAVIS,K AT 7:45AM 06/09/16 BY [facility]   Basic metabolic panel     Status: Abnormal   Collection Time: 06/10/16  4:23 AM  Result Value Ref Range   Sodium 137 135 - 145 mmol/[place]   Potassium 4.5 3.5 - 5.1 mmol/[place]ton   Chloride 111 101 - 111 mmol/Walton   CO2 20 (Walton) 22 - 32 mmol/Walton   Glucose, Bld 89 65 - 99 mg/dL   BUN 45 (H) 6 - 20 mg/dL   Creatinine, Ser 1.35 (H) 0.61 - 1.24 mg/dL    Comment: DELTA CHECK NOTED   Calcium 9.5 8.9 - 10.3 mg/dL   GFR calc non Af Amer 60 ([place]) >60 mL/min   GFR calc Af Amer >60 >60 mL/min    Comment: (NOTE) The eGFR has been calculated using the CKD EPI equation. This calculation has not been validated in all clinical situations. eGFR's persistently <60 mL/min signify possible Chronic Kidney Disease.    Anion gap 6 5 - 15    ABGS No results for input(s): PHART, PO2ART, TCO2, HCO3 in the last 72 hours.  Invalid input(s): PCO2 CULTURES Recent Results (from the past 240 hour(s))  MRSA PCR Screening     Status: Abnormal   Collection Time: 06/09/16   4:00 AM  Result Value Ref Range Status   MRSA by PCR POSITIVE (A) NEGATIVE Final    Comment:        The GeneXpert MRSA Assay (FDA approved for NASAL specimens only), is one component of a comprehensive MRSA colonization surveillance program. It is not intended to diagnose MRSA infection nor to guide or monitor treatment for MRSA infections. RESULT CALLED TO, READ BACK BY AND VERIFIED WITH: SMITH,J AT 0640 BY HUFFINES,S ON 06/09/16.    Studies/Results: Dg Chest Port 1 View  Result Date: 06/09/2016 CLINICAL DATA:  Acute onset of shortness of breath and productive cough. Altered level of consciousness. Initial encounter. EXAM: PORTABLE CHEST 1 VIEW COMPARISON:  Chest radiograph performed 05/17/2016 FINDINGS: The lungs are well-aerated and clear. There is no evidence of focal opacification, pleural effusion or pneumothorax. The cardiomediastinal silhouette is within normal limits. No acute osseous abnormalities are seen. There is mild chronic deformity at the right  lateral sixth rib. IMPRESSION: No acute cardiopulmonary process seen. Electronically Signed   By: Garald Balding M.D.   On: 06/09/2016 00:57    Medications:  Prior to Admission:  Prescriptions Prior to Admission  Medication Sig Dispense Refill Last Dose  . aspirin 81 MG tablet Take 81 mg by mouth every morning.   06/08/2016 at [place]  . atorvastatin (LIPITOR) 80 MG tablet Take 40 mg by mouth every morning.  11 06/08/2016 at [place]  . chlorproMAZINE (THORAZINE) 10 MG tablet Take 1 tablet (10 mg total) by mouth 3 (three) times daily as needed for hiccoughs. 30 tablet 0 06/08/2016 at 1525  . clopidogrel (PLAVIX) 75 MG tablet Take 75 mg by mouth every morning.  11 06/08/2016 at [place]  . diphenhydramine-acetaminophen (TYLENOL PM) 25-500 MG TABS tablet Take 2 tablets by mouth at bedtime as needed (for sleep).   06/03/2016 at Unknown time  . dolutegravir (TIVICAY) 50 MG tablet Take 1 tablet (50 mg total) by mouth daily. 30 tablet  06/08/2016 at  2000  . emtricitabine-tenofovir AF (DESCOVY) 200-25 MG tablet Take 1 tablet by mouth daily. 30 tablet  06/08/2016 at 2100  . fluconazole (DIFLUCAN) 200 MG tablet Take 2 tablets (400 mg total) by mouth daily. 7 tablet 0 06/08/2016 at 2100  . Potassium Chloride ER 20 MEQ TBCR Take 20 mEq by mouth every morning.  0 06/08/2016 at [place]  . promethazine (PHENERGAN) 12.5 MG tablet Take 1 tablet (12.5 mg total) by mouth every 6 (six) hours as needed for nausea (Or for hiccups.). 30 tablet 0 06/08/2016 at 1928  . sulfamethoxazole-trimethoprim (BACTRIM DS,SEPTRA DS) 800-160 MG tablet Take 1 tablet by mouth daily.   06/08/2016 at 800a   Scheduled: . antiseptic oral rinse  7 mL Mouth Rinse BID  . Chlorhexidine Gluconate Cloth  6 each Topical Q0600  . heparin  5,000 Units Subcutaneous Q8H  . mupirocin ointment  1 application Nasal BID  . sodium chloride flush  3 mL Intravenous Q12H   Continuous: . sodium chloride 150 mL/hr at 06/10/16 0553   YOM:[id number], chlorproMAZINE (THORAZINE) IV, metoCLOPramide (REGLAN) injection, morphine injection  Assesment: He was admitted with sepsis acute renal failure. He has had a recent stroke he has had a recent diagnosis of AIDS and he has had a rapid downhill course over the last 2 months. He is clearly dying. His family has requested comfort care. I think that's appropriate. Active Problems:   Severe sepsis (HCC)   Acute renal failure (ARF) (HCC)   Hyperkalemia   Hypercalcemia   Palliative care encounter   Goals of care, counseling/discussion   DNR (do not resuscitate) discussion    Plan: Discussed with hospice regarding potential transfer to inpatient hospice facility    LOS: 1 day   Joseph Walton 06/10/2016, 8:06 AM

## 2016-06-10 NOTE — Clinical Social Work Note (Deleted)
Patient Information   Patient Name Joseph Walton, Joseph Walton ([id number]) Sex Male DOB 1966-03-21  Room Bed  IC06 IC06-01  Patient Demographics   Address [address] [place] [address] Phone [phone] (Home)  Patient Ethnicity & Race   Ethnic Group Patient Race  Not Hispanic or Latino White or Caucasian  Emergency Contact(s)   Name Relation Home Work Mobile  Crouse,Carolyn Mother [phone]  [phone]  Vedia Pereyraillar,Taahir Sr Father [phone]    Documents on File    Status Date Received Description  Documents for the Patient  [place] HIPAA NOTICE OF PRIVACY - Scanned Received 11/27/10   [place] E-Signature HIPAA Notice of Privacy Not Received    [place] E-Signature HIPAA Notice of Privacy Spanish Not Received    Driver's License Not Received    Advance Directives/Living Will/HCPOA/POA Not Received    [place] HIPAA NOTICE OF PRIVACY - Scanned Not Received    Insurance Card Received 02/03/11   Financial Application Not Received    Release of Information Not Received    Insurance Card Not Received    Insurance Card Received 05/13/16 MEDCOST  [place] HIPAA NOTICE OF PRIVACY - Scanned Not Received    [place] HIPAA NOTICE OF PRIVACY - Scanned Not Received    HIM ROI Authorization  04/03/14 [facility]  HIM ROI Authorization  04/04/14 Signed ROI  Other Photo ID Not Received    Insurance Card Received 05/11/16 [facility] [place]  [place] HIPAA NOTICE OF PRIVACY - Scanned Not Received    [place] E-Signature HIPAA Notice of Privacy Signed 06/09/16   Driver's License Not Received    Insurance Card Not Received    Advance Directives/Living Will/HCPOA/POA Not Received    Other Photo ID Not Received    Advance Directives/Living Will/HCPOA/POA  05/21/16   Patient Photo   Photo of Patient  Documents for the Encounter  AOB (Assignment of Insurance Benefits) Received 06/09/16   E-signature AOB Signed 06/09/16   MEDICARE RIGHTS Not Received     E-signature Medicare Rights     ED Patient Billing Extract   ED PB Summary  ED Patient Billing Extract   ED Encounter Summary  Cardiac Monitoring Strip  06/09/16   EKG  06/09/16   Admission Information   Attending Provider Admitting Provider Admission Type Admission Date/Time  Kari BaarsEdward Hawkins, MD Pearson GrippeJames Kim, MD Emergency 06/09/16 0020  Discharge Date Hospital Service Auth/Cert Status Service Area   Internal Medicine Incomplete [place] SERVICE AREA  Unit Room/Bed Admission Status   AP-ICCUP NURSING IC06/IC06-01 Admission (Confirmed)   Admission   Complaint  Shortness of Breath  Hospital Account   Name Acct ID Class Status Primary Coverage  [place], Annamary RummageRonald Walton [id number] Inpatient Open MEDCOST - MEDCOST      Guarantor Account (for Hospital Account [id number])   Name Relation to Pt Service Area Active? Acct Type  Whalin, [name] Self CHSA Yes Personal/Family  Address Phone    [address] [place], [place] [address] [phone](H)        Coverage Information (for Hospital Account [id number])   F/O Payor/Plan Precert #  MEDCOST/MEDCOST   Subscriber Subscriber #  [place], [name] [id number]  Address Phone  [address] [address] [place], [place] [address] [phone]

## 2016-06-10 NOTE — Progress Notes (Signed)
On call MD notified of temp of 101 axillary and of pt continuing to exhibit signs of pain. New orders received.

## 2016-06-10 NOTE — Progress Notes (Signed)
Report given to Hansel StarlingKim Bennett RN [facility].  Patient is to be transferred later today. Currently resting in bed with no signs of distress noted.

## 2016-06-10 NOTE — Progress Notes (Signed)
[facility]. Pain medication given for trip.  Report give to EMS.

## 2016-06-10 NOTE — Clinical Social Work Note (Signed)
Pt now comfort care and is Hospice Home appropriate per MD. CSW discussed Hospice Home with pt's father who is agreeable. Bed available today. [facility] Support provided.  Derenda FennelKara Navy Rothschild, LCSW [phone]

## 2016-06-15 LAB — CULTURE, BLOOD (ROUTINE X 2)
CULTURE: NO GROWTH
CULTURE: NO GROWTH

## 2016-06-26 DEATH — deceased

## 2018-04-27 IMAGING — CT CT HEAD W/O CM
3 of 4 series · 15 of 47 positions shown, 18 images · non-contrast
Comparison: None.

CLINICAL DATA: Development of slurred speech over the last several
months. Possible stroke.

EXAM:
CT HEAD WITHOUT CONTRAST
TECHNIQUE: Contiguous axial images were obtained from the base of the skull
through the vertex without intravenous contrast.

[Series 2: head w/o · axial · non-contrast · 0.45mm/px · z∈[+90,+230]mm · 9 of 41 slices shown, 12 images]
[im 3/41  brain]
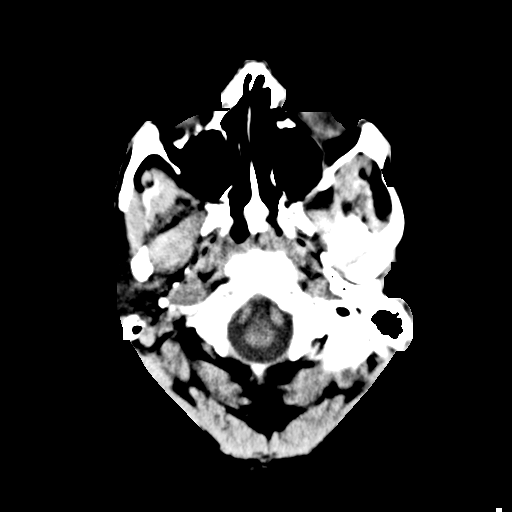
[im 3/41  bone]
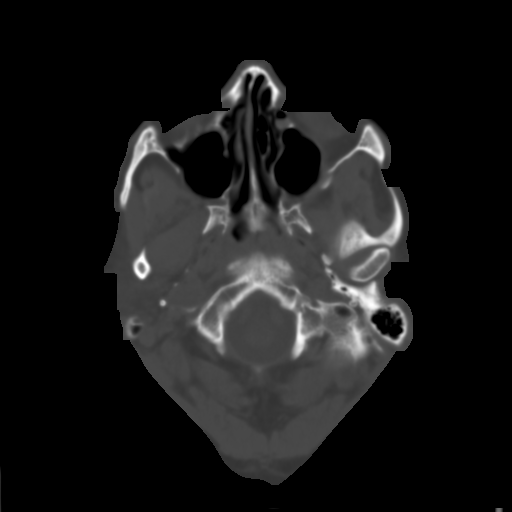
[im 9/41  brain]
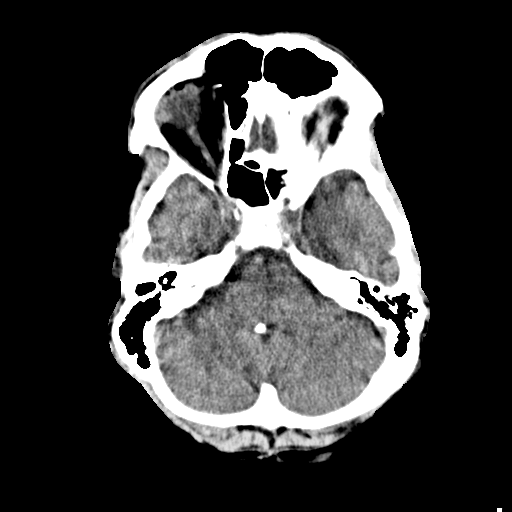
[im 12/41  brain]
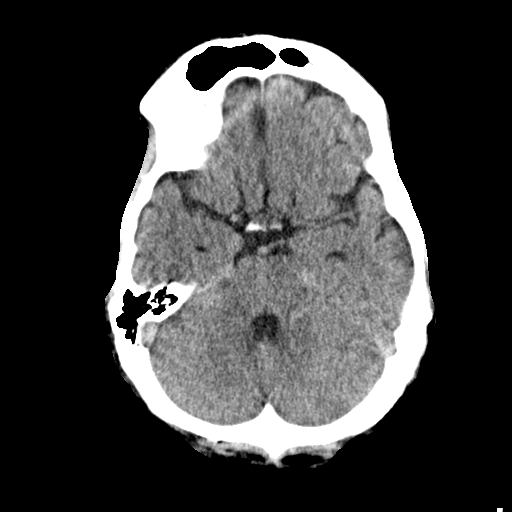
[im 18/41  brain]
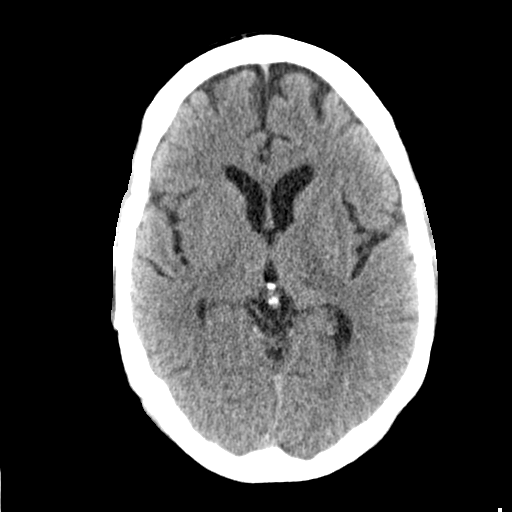
[im 21/41  brain]
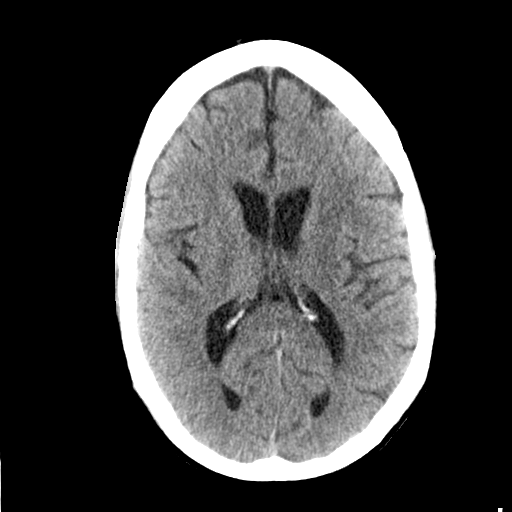
[im 21/41  bone]
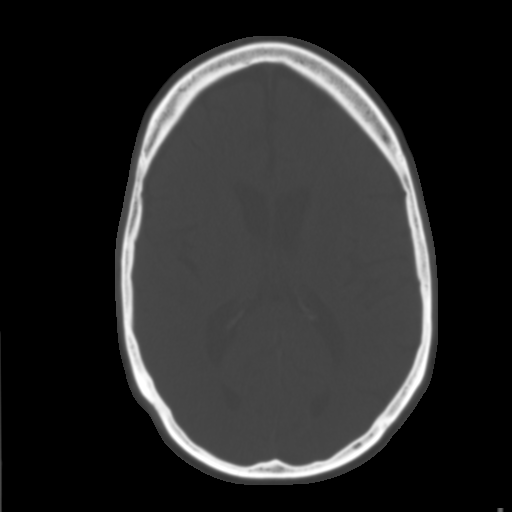
[im 23/41  brain]
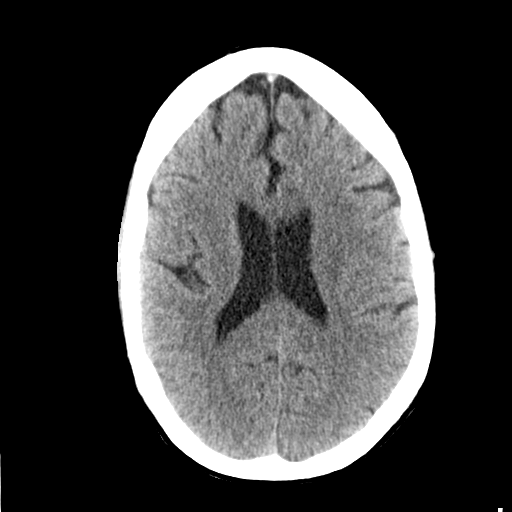
[im 29/41  brain]
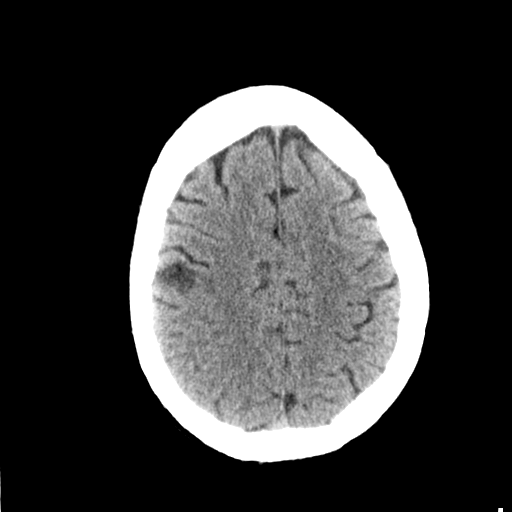
[im 32/41  brain]
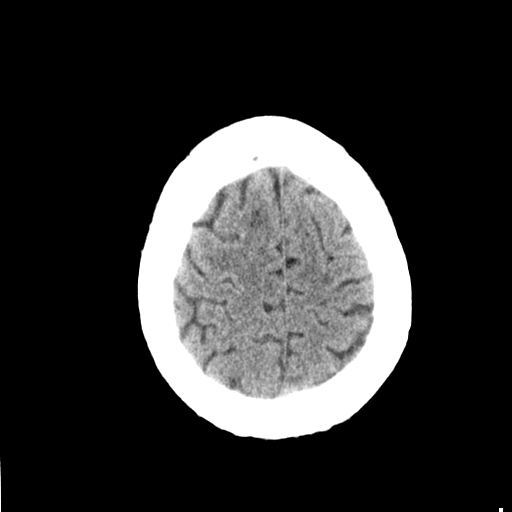
[im 38/41  brain]
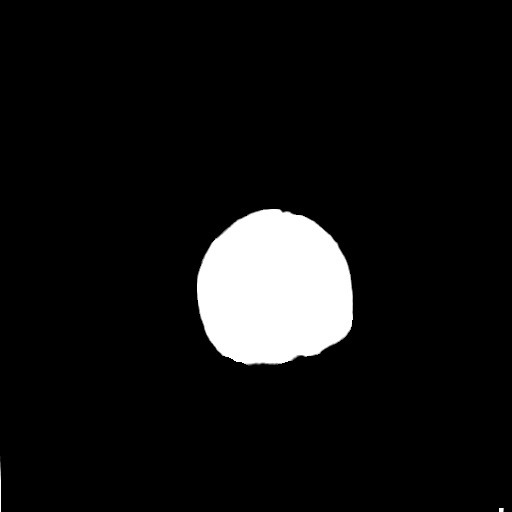
[im 38/41  bone]
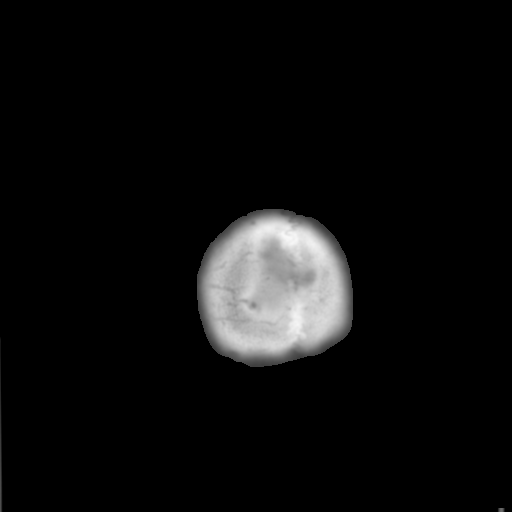

[Series 4: coronal · coronal · 0.31mm/px · 3 of 70 slices shown]
[im 24/70  brain]
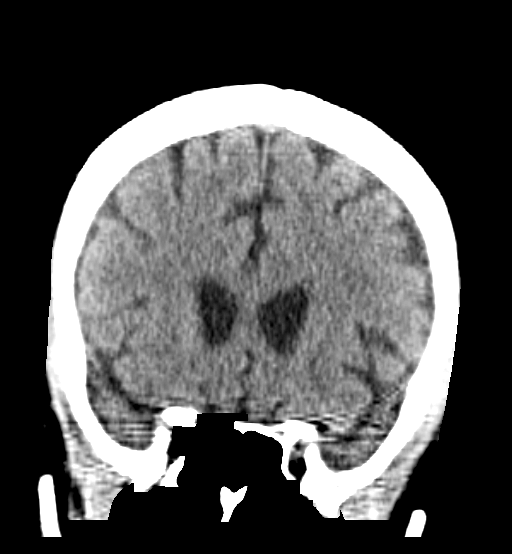
[im 31/70  brain]
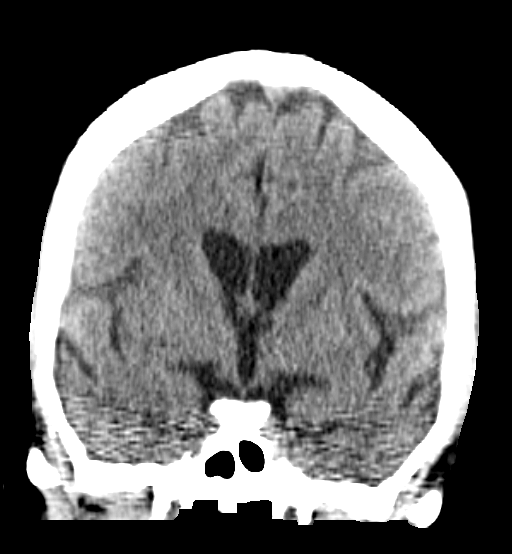
[im 39/70  brain]
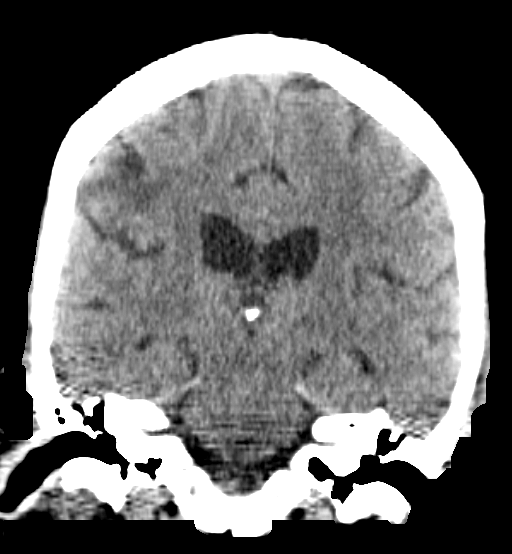

[Series 5: sagittal · sagittal · 0.33mm/px · 3 of 53 slices shown]
[im 18/53  brain]
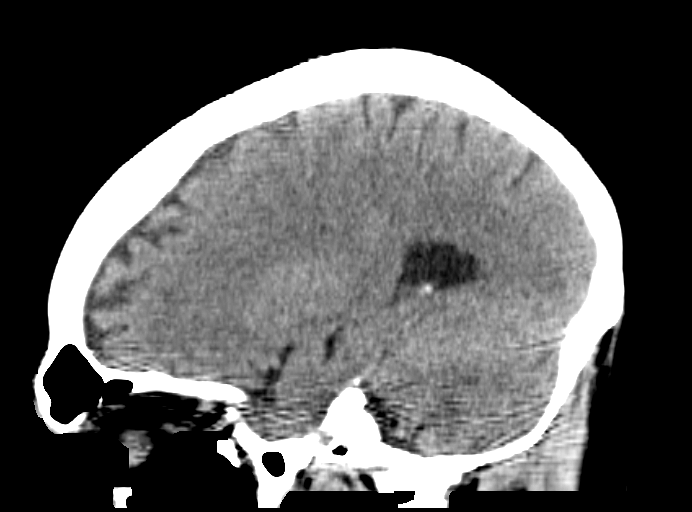
[im 27/53  brain]
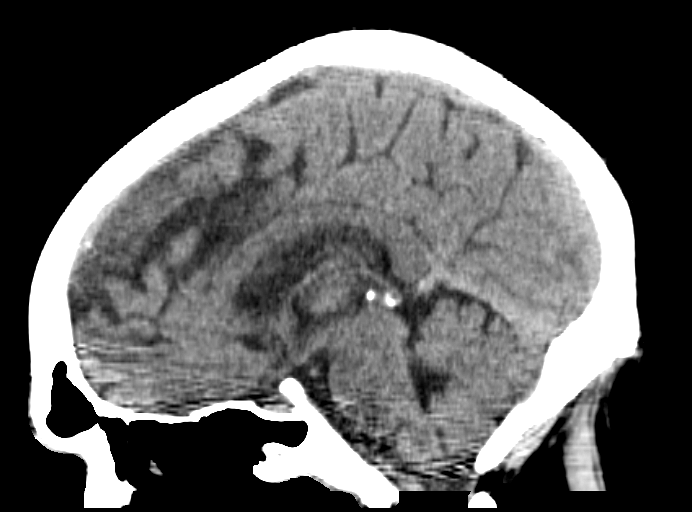
[im 35/53  brain]
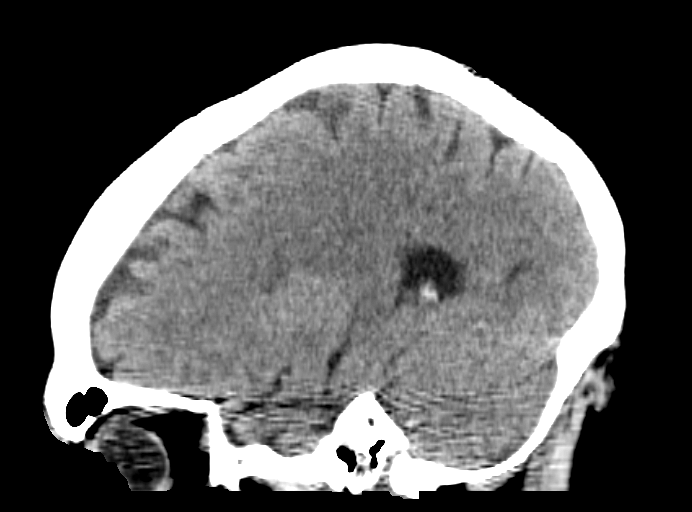

[15 of 47 positions shown; findings below may reference images not displayed]

FINDINGS: There is a late subacute to old cortical and subcortical infarction
in the right posterior frontal cortical and subcortical brain. There
is probably an infarction in the right lateral thalamus/ posterior
limb internal capsule which could be old or subacute. No sign of
mass lesion, hemorrhage, hydrocephalus or extra-axial collection.
The calvarium is unremarkable. Sinuses, middle ears and mastoids are
clear.
IMPRESSION: Late subacute or old cortical and subcortical infarction in the
right posterior frontal lobe. Probable similar age infarction at the
right lateral thalamus/posterior limb internal capsule. No sign of
hemorrhage or mass effect.
